# Patient Record
Sex: Male | Born: 1975
Health system: Southern US, Community
[De-identification: ages and names within clinical notes are randomized; demographics above are authoritative.]

## PROBLEM LIST (undated history)

## (undated) ENCOUNTER — Emergency Department (HOSPITAL_COMMUNITY): Admission: EM | Payer: Self-pay | Source: Home / Self Care

## (undated) DIAGNOSIS — M1990 Unspecified osteoarthritis, unspecified site: Secondary | ICD-10-CM

## (undated) DIAGNOSIS — M109 Gout, unspecified: Secondary | ICD-10-CM

## (undated) DIAGNOSIS — K219 Gastro-esophageal reflux disease without esophagitis: Secondary | ICD-10-CM

## (undated) DIAGNOSIS — Z9889 Other specified postprocedural states: Secondary | ICD-10-CM

## (undated) DIAGNOSIS — J45909 Unspecified asthma, uncomplicated: Secondary | ICD-10-CM

## (undated) DIAGNOSIS — E785 Hyperlipidemia, unspecified: Secondary | ICD-10-CM

## (undated) HISTORY — DX: Gout, unspecified: M10.9

## (undated) HISTORY — DX: Hyperlipidemia, unspecified: E78.5

## (undated) HISTORY — DX: Gastro-esophageal reflux disease without esophagitis: K21.9

---

## 2001-03-21 ENCOUNTER — Emergency Department (HOSPITAL_COMMUNITY): Admission: EM | Admit: 2001-03-21 | Discharge: 2001-03-22 | Payer: Self-pay | Admitting: Emergency Medicine

## 2012-10-19 ENCOUNTER — Telehealth: Payer: Self-pay | Admitting: Family Medicine

## 2013-03-09 ENCOUNTER — Ambulatory Visit (INDEPENDENT_AMBULATORY_CARE_PROVIDER_SITE_OTHER): Payer: Managed Care, Other (non HMO) | Admitting: Family Medicine

## 2013-03-09 ENCOUNTER — Encounter: Payer: Self-pay | Admitting: Family Medicine

## 2013-03-09 VITALS — BP 120/75 | HR 54 | Temp 97.9°F | Ht 75.0 in | Wt 255.8 lb

## 2013-03-09 DIAGNOSIS — M109 Gout, unspecified: Secondary | ICD-10-CM

## 2013-03-09 DIAGNOSIS — Z0000 Encounter for general adult medical examination without abnormal findings: Secondary | ICD-10-CM

## 2013-03-09 DIAGNOSIS — K219 Gastro-esophageal reflux disease without esophagitis: Secondary | ICD-10-CM

## 2013-03-09 LAB — POCT CBC
Granulocyte percent: 75.1 %G (ref 37–80)
HCT, POC: 48.4 % (ref 43.5–53.7)
Hemoglobin: 15.9 g/dL (ref 14.1–18.1)
Lymph, poc: 1.5 (ref 0.6–3.4)
MCH, POC: 31.3 pg — AB (ref 27–31.2)
MCHC: 32.8 g/dL (ref 31.8–35.4)
MCV: 95.5 fL (ref 80–97)
MPV: 8.4 fL (ref 0–99.8)
POC Granulocyte: 5 (ref 2–6.9)
POC LYMPH PERCENT: 23.1 %L (ref 10–50)
Platelet Count, POC: 220 10*3/uL (ref 142–424)
RBC: 5.1 M/uL (ref 4.69–6.13)
RDW, POC: 12.6 %
WBC: 6.6 10*3/uL (ref 4.6–10.2)

## 2013-03-09 MED ORDER — INDOMETHACIN ER 75 MG PO CPCR: 75.0000 mg | ORAL_CAPSULE | Freq: Two times a day (BID) | ORAL | Status: DC

## 2013-03-09 MED ORDER — OMEPRAZOLE 40 MG PO CPDR: 40.0000 mg | DELAYED_RELEASE_CAPSULE | Freq: Every day | ORAL | Status: DC

## 2013-03-09 MED ORDER — ALLOPURINOL 300 MG PO TABS: 300.0000 mg | ORAL_TABLET | Freq: Every day | ORAL | Status: DC

## 2013-03-09 NOTE — Progress Notes (Signed)
  Subjective:    Patient ID: Edwin Moreno, male    DOB: [birth year], 37 y.o.   MRN: [id number]  HPI This 37 y.o. male presents for evaluation of routine visit.  He has hx of gout And GERD.  He has occasional gout flares.  He denies any other acute problems. He states his GERD is controlled with prilosec.   Review of Systems No chest pain, SOB, HA, dizziness, vision change, N/V, diarrhea, constipation, dysuria, urinary urgency or frequency, myalgias, arthralgias or rash.     Objective:   Physical Exam Vital signs noted  Well developed well nourished male.  HEENT - Head atraumatic Normocephalic                Eyes - PERRLA, Conjuctiva - clear Sclera- Clear EOMI                Ears - EAC's Wnl TM's Wnl Gross Hearing WNL                Nose - Nares patent                 Throat - oropharanx wnl Respiratory - Lungs CTA bilateral Cardiac - RRR S1 and S2 without murmur GI - Abdomen soft Nontender and bowel sounds active x 4 Extremities - No edema. Neuro - Grossly intact.       Assessment & Plan:  Gout - Plan: allopurinol (ZYLOPRIM) 300 MG tablet, indomethacin (INDOCIN SR) 75 MG CR capsule, Thyroid Panel With TSH.  Discussed low purine diet.  GERD (gastroesophageal reflux disease) - Plan: omeprazole (PRILOSEC) 40 MG capsule  Routine general medical examination at a health care facility - Plan: POCT CBC, CMP14+EGFR, Thyroid Panel With TSH, Lipid panel, Uric acid  Follow up annually.

## 2013-03-09 NOTE — Patient Instructions (Signed)
Gout  Gout is an inflammatory condition (arthritis) caused by a buildup of uric acid crystals in the joints. Uric acid is a chemical that is normally present in the blood. Under some circumstances, uric acid can form into crystals in your joints. This causes joint redness, soreness, and swelling (inflammation). Repeat attacks are common. Over time, uric acid crystals can form into masses (tophi) near a joint, causing disfigurement. Gout is treatable and often preventable.  CAUSES   The disease begins with elevated levels of uric acid in the blood. Uric acid is produced by your body when it breaks down a naturally found substance called purines. This also happens when you eat certain foods such as meats and fish. Causes of an elevated uric acid level include:   Being passed down from parent to child (heredity).   Diseases that cause increased uric acid production (obesity, psoriasis, some cancers).   Excessive alcohol use.   Diet, especially diets rich in meat and seafood.   Medicines, including certain cancer-fighting drugs (chemotherapy), diuretics, and aspirin.   Chronic kidney disease. The kidneys are no longer able to remove uric acid well.   Problems with metabolism.  Conditions strongly associated with gout include:   Obesity.   High blood pressure.   High cholesterol.   Diabetes.  Not everyone with elevated uric acid levels gets gout. It is not understood why some people get gout and others do not. Surgery, joint injury, and eating too much of certain foods are some of the factors that can lead to gout.  SYMPTOMS    An attack of gout comes on quickly. It causes intense pain with redness, swelling, and warmth in a joint.   Fever can occur.   Often, only one joint is involved. Certain joints are more commonly involved:   Base of the big toe.   Knee.   Ankle.   Wrist.   Finger.  Without treatment, an attack usually goes away in a few days to weeks. Between attacks, you usually will not have  symptoms, which is different from many other forms of arthritis.  DIAGNOSIS   Your caregiver will suspect gout based on your symptoms and exam. Removal of fluid from the joint (arthrocentesis) is done to check for uric acid crystals. Your caregiver will give you a medicine that numbs the area (local anesthetic) and use a needle to remove joint fluid for exam. Gout is confirmed when uric acid crystals are seen in joint fluid, using a special microscope. Sometimes, blood, urine, and X-ray tests are also used.  TREATMENT   There are 2 phases to gout treatment: treating the sudden onset (acute) attack and preventing attacks (prophylaxis).  Treatment of an Acute Attack   Medicines are used. These include anti-inflammatory medicines or steroid medicines.   An injection of steroid medicine into the affected joint is sometimes necessary.   The painful joint is rested. Movement can worsen the arthritis.   You may use warm or cold treatments on painful joints, depending which works best for you.   Discuss the use of coffee, vitamin C, or cherries with your caregiver. These may be helpful treatment options.  Treatment to Prevent Attacks  After the acute attack subsides, your caregiver may advise prophylactic medicine. These medicines either help your kidneys eliminate uric acid from your body or decrease your uric acid production. You may need to stay on these medicines for a very long time.  The early phase of treatment with prophylactic medicine can be associated   with an increase in acute gout attacks. For this reason, during the first few months of treatment, your caregiver may also advise you to take medicines usually used for acute gout treatment. Be sure you understand your caregiver's directions.  You should also discuss dietary treatment with your caregiver. Certain foods such as meats and fish can increase uric acid levels. Other foods such as dairy can decrease levels. Your caregiver can give you a list of foods  to avoid.  HOME CARE INSTRUCTIONS    Do not take aspirin to relieve pain. This raises uric acid levels.   Only take over-the-counter or prescription medicines for pain, discomfort, or fever as directed by your caregiver.   Rest the joint as much as possible. When in bed, keep sheets and blankets off painful areas.   Keep the affected joint raised (elevated).   Use crutches if the painful joint is in your leg.   Drink enough water and fluids to keep your urine clear or pale yellow. This helps your body get rid of uric acid. Do not drink alcoholic beverages. They slow the passage of uric acid.   Follow your caregiver's dietary instructions. Pay careful attention to the amount of protein you eat. Your daily diet should emphasize fruits, vegetables, whole grains, and fat-free or low-fat milk products.   Maintain a healthy body weight.  SEEK MEDICAL CARE IF:    You have an oral temperature above 102 F (38.9 C).   You develop diarrhea, vomiting, or any side effects from medicines.   You do not feel better in 24 hours, or you are getting worse.  SEEK IMMEDIATE MEDICAL CARE IF:    Your joint becomes suddenly more tender and you have:   Chills.   An oral temperature above 102 F (38.9 C), not controlled by medicine.  MAKE SURE YOU:    Understand these instructions.   Will watch your condition.   Will get help right away if you are not doing well or get worse.  Document Released: 06/18/2000 Document Revised: 09/13/2011 Document Reviewed: 09/29/2009  [employer] Patient Information 2014 [employer].

## 2013-03-12 LAB — THYROID PANEL WITH TSH
Free Thyroxine Index: 2.8 (ref 1.2–4.9)
T3 Uptake Ratio: 30 % (ref 24–39)
T4, Total: 9.3 ug/dL (ref 4.5–12.0)
TSH: 1.75 u[IU]/mL (ref 0.450–4.500)

## 2013-03-12 LAB — CMP14+EGFR
ALT: 33 IU/L (ref 0–44)
AST: 24 IU/L (ref 0–40)
Albumin/Globulin Ratio: 2.3 (ref 1.1–2.5)
Albumin: 5 g/dL (ref 3.5–5.5)
Alkaline Phosphatase: 80 IU/L (ref 39–117)
BUN/Creatinine Ratio: 14 (ref 8–19)
BUN: 14 mg/dL (ref 6–20)
CO2: 25 mmol/L (ref 18–29)
Calcium: 10.2 mg/dL (ref 8.7–10.2)
Chloride: 97 mmol/L (ref 97–108)
Creatinine, Ser: 1.02 mg/dL (ref 0.76–1.27)
GFR calc Af Amer: 109 mL/min/{1.73_m2} (ref >59)
GFR calc non Af Amer: 94 mL/min/{1.73_m2} (ref >59)
Globulin, Total: 2.2 g/dL (ref 1.5–4.5)
Glucose: 82 mg/dL (ref 65–99)
Potassium: 4.3 mmol/L (ref 3.5–5.2)
Sodium: 140 mmol/L (ref 134–144)
Total Bilirubin: 1.1 mg/dL (ref 0.0–1.2)
Total Protein: 7.2 g/dL (ref 6.0–8.5)

## 2013-03-12 LAB — LIPID PANEL
Chol/HDL Ratio: 6.8 ratio units — ABNORMAL HIGH (ref 0.0–5.0)
Cholesterol, Total: 231 mg/dL — ABNORMAL HIGH (ref 100–199)
HDL: 34 mg/dL — ABNORMAL LOW (ref >39)
LDL Calculated: 149 mg/dL — ABNORMAL HIGH (ref 0–99)
Triglycerides: 240 mg/dL — ABNORMAL HIGH (ref 0–149)
VLDL Cholesterol Cal: 48 mg/dL — ABNORMAL HIGH (ref 5–40)

## 2013-03-12 LAB — URIC ACID: Uric Acid: 6.2 mg/dL (ref 3.7–8.6)

## 2013-03-14 ENCOUNTER — Other Ambulatory Visit: Payer: Self-pay | Admitting: Family Medicine

## 2013-03-14 MED ORDER — PRAVASTATIN SODIUM 20 MG PO TABS: 20.0000 mg | ORAL_TABLET | Freq: Every day | ORAL | Status: DC

## 2013-03-23 ENCOUNTER — Telehealth: Payer: Self-pay | Admitting: *Deleted

## 2013-03-23 NOTE — Telephone Encounter (Signed)
Message copied by Bearl Mulberry on Fri Mar 23, 2013  5:10 PM ------      Message from: Deatra Canter      Created: Wed Mar 14, 2013  3:37 PM       Cholesterol elevated and start pravachol 20mg  po qd and recheck FLP and LFT in 3 months ------

## 2013-03-23 NOTE — Telephone Encounter (Signed)
Pt notified of results Verbalizes understanding 

## 2013-06-25 ENCOUNTER — Ambulatory Visit (INDEPENDENT_AMBULATORY_CARE_PROVIDER_SITE_OTHER): Payer: Managed Care, Other (non HMO)

## 2013-06-25 DIAGNOSIS — Z23 Encounter for immunization: Secondary | ICD-10-CM

## 2014-02-04 ENCOUNTER — Encounter: Payer: Self-pay | Admitting: Family Medicine

## 2014-02-04 ENCOUNTER — Ambulatory Visit (INDEPENDENT_AMBULATORY_CARE_PROVIDER_SITE_OTHER): Payer: PRIVATE HEALTH INSURANCE | Admitting: Family Medicine

## 2014-02-04 VITALS — BP 130/76 | HR 74 | Temp 97.9°F | Ht 75.0 in | Wt 260.0 lb

## 2014-02-04 DIAGNOSIS — M10461 Other secondary gout, right knee: Secondary | ICD-10-CM

## 2014-02-04 DIAGNOSIS — K2100 Gastro-esophageal reflux disease with esophagitis, without bleeding: Secondary | ICD-10-CM

## 2014-02-04 DIAGNOSIS — M545 Low back pain, unspecified: Secondary | ICD-10-CM

## 2014-02-04 DIAGNOSIS — M109 Gout, unspecified: Secondary | ICD-10-CM

## 2014-02-04 DIAGNOSIS — E785 Hyperlipidemia, unspecified: Secondary | ICD-10-CM

## 2014-02-04 MED ORDER — CYCLOBENZAPRINE HCL 10 MG PO TABS: 10.0000 mg | ORAL_TABLET | Freq: Three times a day (TID) | ORAL | Status: DC | PRN

## 2014-02-04 MED ORDER — ALLOPURINOL 300 MG PO TABS: 300.0000 mg | ORAL_TABLET | Freq: Every day | ORAL | Status: DC

## 2014-02-04 MED ORDER — OMEPRAZOLE 40 MG PO CPDR: 40.0000 mg | DELAYED_RELEASE_CAPSULE | Freq: Every day | ORAL | Status: DC

## 2014-02-04 MED ORDER — INDOMETHACIN ER 75 MG PO CPCR: 75.0000 mg | ORAL_CAPSULE | Freq: Two times a day (BID) | ORAL | Status: DC

## 2014-02-04 MED ORDER — PRAVASTATIN SODIUM 20 MG PO TABS: 20.0000 mg | ORAL_TABLET | Freq: Every day | ORAL | Status: DC

## 2014-02-05 NOTE — Progress Notes (Signed)
   Subjective:    Patient ID: MARKEE MATERA, male    DOB: [birth year], 38 y.o.   MRN: [id number]  HPI This 38 y.o. male presents for evaluation of lower back pain and is here for routine follow up.   Review of Systems No chest pain, SOB, HA, dizziness, vision change, N/V, diarrhea, constipation, dysuria, urinary urgency or frequency, myalgias, arthralgias or rash.     Objective:   Physical Exam Vital signs noted  Well developed well nourished male.  HEENT - Head atraumatic Normocephalic                Eyes - PERRLA, Conjuctiva - clear Sclera- Clear EOMI                Ears - EAC's Wnl TM's Wnl Gross Hearing WNL                Nose - Nares patent                 Throat - oropharanx wnl Respiratory - Lungs CTA bilateral Cardiac - RRR S1 and S2 without murmur GI - Abdomen soft Nontender and bowel sounds active x 4 Extremities - No edema. Neuro - Grossly intact. MS - TTP left LS muscles      Assessment & Plan:  Other secondary gout of right knee - Plan: allopurinol (ZYLOPRIM) 300 MG tablet, indomethacin (INDOCIN SR) 75 MG CR capsule  Gastroesophageal reflux disease with esophagitis - Plan: omeprazole (PRILOSEC) 40 MG capsule  Other and unspecified hyperlipidemia - Plan: pravastatin (PRAVACHOL) 20 MG tablet  Left-sided low back pain without sciatica - Plan: cyclobenzaprine (FLEXERIL) 10 MG tablet  Lysbeth Penner FNP

## 2014-04-16 ENCOUNTER — Ambulatory Visit (INDEPENDENT_AMBULATORY_CARE_PROVIDER_SITE_OTHER): Payer: PRIVATE HEALTH INSURANCE

## 2014-04-16 DIAGNOSIS — Z23 Encounter for immunization: Secondary | ICD-10-CM

## 2014-04-23 ENCOUNTER — Telehealth: Payer: Self-pay | Admitting: Family Medicine

## 2014-04-23 NOTE — Telephone Encounter (Signed)
Spoke with pt regarding appt.  ?

## 2014-09-04 ENCOUNTER — Telehealth: Payer: Self-pay | Admitting: Family Medicine

## 2014-09-04 NOTE — Telephone Encounter (Signed)
Pt aware.

## 2014-12-03 ENCOUNTER — Ambulatory Visit: Payer: PRIVATE HEALTH INSURANCE | Admitting: Nurse Practitioner

## 2014-12-03 ENCOUNTER — Telehealth: Payer: Self-pay | Admitting: *Deleted

## 2014-12-03 ENCOUNTER — Telehealth: Payer: PRIVATE HEALTH INSURANCE | Admitting: Nurse Practitioner

## 2014-12-03 DIAGNOSIS — J0100 Acute maxillary sinusitis, unspecified: Secondary | ICD-10-CM

## 2014-12-03 MED ORDER — AMOXICILLIN-POT CLAVULANATE 875-125 MG PO TABS: 1.0000 | ORAL_TABLET | Freq: Two times a day (BID) | ORAL | Status: DC

## 2014-12-03 NOTE — Progress Notes (Signed)

## 2014-12-03 NOTE — Telephone Encounter (Signed)
Pt wanted RX sent into [facility] changed

## 2014-12-17 ENCOUNTER — Other Ambulatory Visit: Payer: Self-pay | Admitting: Family Medicine

## 2014-12-23 ENCOUNTER — Other Ambulatory Visit: Payer: Self-pay | Admitting: Family Medicine

## 2014-12-23 NOTE — Telephone Encounter (Signed)
Last seen 02/04/14  B [place]

## 2014-12-24 ENCOUNTER — Other Ambulatory Visit: Payer: Self-pay

## 2014-12-24 DIAGNOSIS — M10461 Other secondary gout, right knee: Secondary | ICD-10-CM

## 2015-01-01 ENCOUNTER — Ambulatory Visit (INDEPENDENT_AMBULATORY_CARE_PROVIDER_SITE_OTHER): Payer: PRIVATE HEALTH INSURANCE | Admitting: Physician Assistant

## 2015-01-01 ENCOUNTER — Encounter: Payer: Self-pay | Admitting: Physician Assistant

## 2015-01-01 VITALS — BP 127/82 | HR 59 | Temp 98.0°F | Ht 75.0 in | Wt 258.0 lb

## 2015-01-01 DIAGNOSIS — M542 Cervicalgia: Secondary | ICD-10-CM

## 2015-01-01 DIAGNOSIS — M10461 Other secondary gout, right knee: Secondary | ICD-10-CM | POA: Diagnosis not present

## 2015-01-01 DIAGNOSIS — M1000 Idiopathic gout, unspecified site: Secondary | ICD-10-CM | POA: Diagnosis not present

## 2015-01-01 MED ORDER — INDOMETHACIN ER 75 MG PO CPCR: 75.0000 mg | ORAL_CAPSULE | Freq: Two times a day (BID) | ORAL | Status: DC

## 2015-01-01 NOTE — Patient Instructions (Signed)

## 2015-01-01 NOTE — Progress Notes (Signed)
Subjective:     Patient ID: Edwin Moreno, male   DOB: 1976/06/04, 39 y.o.   MRN: [id number]  HPI Pt was working out on incline bench when he felt a pull to the L post neck/scalp area Sx only lasted a short time Now over the last week will have pain to the same area that radiates to the scalp causing headaches No OTC meds for sx No radiation to the arm/shoulder No hx of same Sx worse with sitting  Review of Systems  Constitutional: Negative.   Musculoskeletal: Positive for arthralgias and neck pain.       Objective:   Physical Exam NAD FROM of the C-spine- sx at extremes of all motions Shoulder shrug equal but with some sx No palp spasm + TTP of the L trap area to the post cerv region Strength equal in the upper ext Pulses /sensory good to upper ext    Assessment:     Neck pain/Trap strain    Plan:     Heat/Ice OTC NSAIDS Massage Gentle stretching Return to lifting slowly and as tol Pt also wanting rf on his Indocin for Gout flares It has been ext time since he has had labs so this was done today and med rf'd

## 2015-01-02 LAB — CMP14+EGFR
A/G RATIO: 2.1 (ref 1.1–2.5)
ALT: 31 IU/L (ref 0–44)
AST: 28 IU/L (ref 0–40)
Albumin: 4.7 g/dL (ref 3.5–5.5)
Alkaline Phosphatase: 74 IU/L (ref 39–117)
BUN / CREAT RATIO: 15 (ref 8–19)
BUN: 15 mg/dL (ref 6–20)
Bilirubin Total: 0.6 mg/dL (ref 0.0–1.2)
CALCIUM: 9.7 mg/dL (ref 8.7–10.2)
CO2: 23 mmol/L (ref 18–29)
CREATININE: 0.97 mg/dL (ref 0.76–1.27)
Chloride: 101 mmol/L (ref 97–108)
GFR calc Af Amer: 114 mL/min/{1.73_m2} (ref >59)
GFR calc non Af Amer: 99 mL/min/{1.73_m2} (ref >59)
Globulin, Total: 2.2 g/dL (ref 1.5–4.5)
Glucose: 80 mg/dL (ref 65–99)
POTASSIUM: 4.3 mmol/L (ref 3.5–5.2)
Sodium: 143 mmol/L (ref 134–144)
TOTAL PROTEIN: 6.9 g/dL (ref 6.0–8.5)

## 2015-01-03 NOTE — Progress Notes (Signed)
lmtcb

## 2015-02-07 ENCOUNTER — Ambulatory Visit (INDEPENDENT_AMBULATORY_CARE_PROVIDER_SITE_OTHER): Payer: PRIVATE HEALTH INSURANCE | Admitting: Family Medicine

## 2015-02-07 ENCOUNTER — Encounter: Payer: Self-pay | Admitting: Family Medicine

## 2015-02-07 ENCOUNTER — Ambulatory Visit (INDEPENDENT_AMBULATORY_CARE_PROVIDER_SITE_OTHER): Payer: PRIVATE HEALTH INSURANCE

## 2015-02-07 DIAGNOSIS — M545 Low back pain, unspecified: Secondary | ICD-10-CM

## 2015-02-07 DIAGNOSIS — M542 Cervicalgia: Secondary | ICD-10-CM | POA: Diagnosis not present

## 2015-02-07 MED ORDER — PREDNISONE 10 MG PO TABS: ORAL_TABLET | ORAL | Status: DC

## 2015-02-07 MED ORDER — CYCLOBENZAPRINE HCL 10 MG PO TABS: 10.0000 mg | ORAL_TABLET | Freq: Three times a day (TID) | ORAL | Status: DC | PRN

## 2015-02-07 NOTE — Progress Notes (Signed)
Subjective:  Patient ID: Edwin Moreno, male    DOB: 20-Aug-1975  Age: 39 y.o. MRN: [id number]  CC: Neck Pain   HPI Edwin Moreno presents for neck pain increasing for one month. He has 4/10 posterior neck pain. This is worsened by turning his head from one side to the other although he is able to do so. It is making his work difficult but he is not in a position to take time off. This has not affected his vision speech hearing etc. Pain at neck described as a sharp pain. It is actually made better by laying down and worsens through the day as he has to be up and about.  History Edwin Moreno has no past medical history on file.   He has no past surgical history on file.   His family history is not on file.He reports that he has never smoked. He does not have any smokeless tobacco history on file. He reports that he drinks alcohol. He reports that he does not use illicit drugs.  Outpatient Prescriptions Prior to Visit  Medication Sig Dispense Refill  . allopurinol (ZYLOPRIM) 300 MG tablet Take 1 tablet (300 mg total) by mouth daily. 90 tablet 4  . indomethacin (INDOCIN SR) 75 MG CR capsule Take 1 capsule (75 mg total) by mouth 2 (two) times daily with a meal. 180 capsule 4  . omeprazole (PRILOSEC) 40 MG capsule Take 1 capsule (40 mg total) by mouth daily. 90 capsule 4  . pravastatin (PRAVACHOL) 20 MG tablet Take 1 tablet (20 mg total) by mouth daily. 90 tablet 3  . cyclobenzaprine (FLEXERIL) 10 MG tablet Take 1 tablet (10 mg total) by mouth 3 (three) times daily as needed for muscle spasms. (Patient not taking: Reported on 02/07/2015) 30 tablet 0   No facility-administered medications prior to visit.    ROS Review of Systems  Constitutional: Negative for fever, chills and diaphoresis.  HENT: Negative for congestion, rhinorrhea and sore throat.   Respiratory: Negative for cough, shortness of breath and wheezing.   Cardiovascular: Negative for chest pain.  Gastrointestinal: Negative for  nausea, vomiting, abdominal pain, diarrhea, constipation and abdominal distention.  Genitourinary: Negative for dysuria and frequency.  Musculoskeletal: Negative for joint swelling and arthralgias.  Skin: Negative for rash.  Neurological: Negative for headaches.    Objective:  BP 125/84 mmHg  Pulse 72  Temp(Src) 98.1 F (36.7 C) (Oral)  Ht 6\' 3"  (1.905 m)  Wt 253 lb 6.4 oz (114.941 kg)  BMI 31.67 kg/m2  BP Readings from Last 3 Encounters:  02/07/15 125/84  01/01/15 127/82  02/04/14 130/76    Wt Readings from Last 3 Encounters:  02/07/15 253 lb 6.4 oz (114.941 kg)  01/01/15 258 lb (117.028 kg)  02/04/14 260 lb (117.935 kg)     Physical Exam  Constitutional: He is oriented to person, place, and time. He appears well-developed and well-nourished.  HENT:  Head: Normocephalic and atraumatic.  Right Ear: Tympanic membrane and external ear normal. No decreased hearing is noted.  Left Ear: Tympanic membrane and external ear normal. No decreased hearing is noted.  Mouth/Throat: No oropharyngeal exudate or posterior oropharyngeal erythema.  Eyes: Pupils are equal, round, and reactive to light.  Neck: Normal range of motion. No tracheal deviation present. No thyromegaly present.  Although the range of motion is normal it is somewhat stiff and slow. There is tightness noted in the musculature of the posterior cervical spinalis  Cardiovascular: Normal rate and regular rhythm.  No murmur heard. Pulmonary/Chest: Breath sounds normal. No stridor. No respiratory distress.  Musculoskeletal: He exhibits tenderness (posterior neck).  Lymphadenopathy:    He has no cervical adenopathy.  Neurological: He is alert and oriented to person, place, and time. No cranial nerve deficit.  Skin: Skin is warm and dry.  Psychiatric: He has a normal mood and affect.  Vitals reviewed.   No results found for: HGBA1C  Lab Results  Component Value Date   WBC 6.6 03/09/2013   HGB 15.9 03/09/2013    HCT 48.4 03/09/2013   GLUCOSE 80 01/01/2015   CHOL 231* 03/09/2013   TRIG 240* 03/09/2013   HDL 34* 03/09/2013   LDLCALC 149* 03/09/2013   ALT 31 01/01/2015   AST 28 01/01/2015   NA 143 01/01/2015   K 4.3 01/01/2015   CL 101 01/01/2015   CREATININE 0.97 01/01/2015   BUN 15 01/01/2015   CO2 23 01/01/2015   TSH 1.750 03/09/2013    No results found.  Assessment & Plan:   [name] was seen today for neck pain.  Diagnoses and all orders for this visit:  Cervicalgia Orders: -     cyclobenzaprine (FLEXERIL) 10 MG tablet; Take 1 tablet (10 mg total) by mouth 3 (three) times daily as needed for muscle spasms. -     predniSONE (DELTASONE) 10 MG tablet; Take 5 daily for 3 days followed by 4,3,2 and 1 for 3 days each. -     DG Cervical Spine Complete; Future   I am having Mr. Luddy start on predniSONE. I am also having him maintain his allopurinol, pravastatin, omeprazole, indomethacin, and cyclobenzaprine.  Meds ordered this encounter  Medications  . cyclobenzaprine (FLEXERIL) 10 MG tablet    Sig: Take 1 tablet (10 mg total) by mouth 3 (three) times daily as needed for muscle spasms.    Dispense:  30 tablet    Refill:  0  . predniSONE (DELTASONE) 10 MG tablet    Sig: Take 5 daily for 3 days followed by 4,3,2 and 1 for 3 days each.    Dispense:  45 tablet    Refill:  0     Follow-up: Return in about 2 weeks (around 02/21/2015), or if symptoms worsen or fail to improve.  [name], M.D.

## 2015-04-30 ENCOUNTER — Other Ambulatory Visit (INDEPENDENT_AMBULATORY_CARE_PROVIDER_SITE_OTHER): Payer: PRIVATE HEALTH INSURANCE

## 2015-04-30 DIAGNOSIS — Z0000 Encounter for general adult medical examination without abnormal findings: Secondary | ICD-10-CM

## 2015-04-30 DIAGNOSIS — M109 Gout, unspecified: Secondary | ICD-10-CM

## 2015-04-30 DIAGNOSIS — E785 Hyperlipidemia, unspecified: Secondary | ICD-10-CM

## 2015-04-30 DIAGNOSIS — K219 Gastro-esophageal reflux disease without esophagitis: Secondary | ICD-10-CM

## 2015-05-01 ENCOUNTER — Other Ambulatory Visit: Payer: Self-pay | Admitting: Family Medicine

## 2015-05-01 LAB — CBC WITH DIFFERENTIAL/PLATELET
Basophils Absolute: 0 10*3/uL (ref 0.0–0.2)
Basos: 0 %
EOS (ABSOLUTE): 0.1 10*3/uL (ref 0.0–0.4)
EOS: 1 %
HEMOGLOBIN: 14.9 g/dL (ref 12.6–17.7)
Hematocrit: 43.3 % (ref 37.5–51.0)
IMMATURE GRANULOCYTES: 0 %
Immature Grans (Abs): 0 10*3/uL (ref 0.0–0.1)
LYMPHS: 37 %
Lymphocytes Absolute: 2.2 10*3/uL (ref 0.7–3.1)
MCH: 33.3 pg — ABNORMAL HIGH (ref 26.6–33.0)
MCHC: 34.4 g/dL (ref 31.5–35.7)
MCV: 97 fL (ref 79–97)
MONOCYTES: 7 %
Monocytes Absolute: 0.4 10*3/uL (ref 0.1–0.9)
NEUTROS PCT: 55 %
Neutrophils Absolute: 3.1 10*3/uL (ref 1.4–7.0)
Platelets: 269 10*3/uL (ref 150–379)
RBC: 4.48 x10E6/uL (ref 4.14–5.80)
RDW: 13.7 % (ref 12.3–15.4)
WBC: 5.8 10*3/uL (ref 3.4–10.8)

## 2015-05-01 LAB — VITAMIN D 25 HYDROXY (VIT D DEFICIENCY, FRACTURES): Vit D, 25-Hydroxy: 28.3 ng/mL — ABNORMAL LOW (ref 30.0–100.0)

## 2015-05-01 LAB — CMP14+EGFR
ALBUMIN: 4.8 g/dL (ref 3.5–5.5)
ALT: 31 IU/L (ref 0–44)
AST: 19 IU/L (ref 0–40)
Albumin/Globulin Ratio: 2 (ref 1.1–2.5)
Alkaline Phosphatase: 64 IU/L (ref 39–117)
BUN / CREAT RATIO: 11 (ref 8–19)
BUN: 12 mg/dL (ref 6–20)
Bilirubin Total: 0.7 mg/dL (ref 0.0–1.2)
CALCIUM: 9.6 mg/dL (ref 8.7–10.2)
CO2: 26 mmol/L (ref 18–29)
CREATININE: 1.1 mg/dL (ref 0.76–1.27)
Chloride: 101 mmol/L (ref 97–106)
GFR calc Af Amer: 97 mL/min/{1.73_m2} (ref >59)
GFR, EST NON AFRICAN AMERICAN: 84 mL/min/{1.73_m2} (ref >59)
Globulin, Total: 2.4 g/dL (ref 1.5–4.5)
Glucose: 76 mg/dL (ref 65–99)
Potassium: 4.4 mmol/L (ref 3.5–5.2)
SODIUM: 144 mmol/L (ref 136–144)
Total Protein: 7.2 g/dL (ref 6.0–8.5)

## 2015-05-01 LAB — LIPID PANEL
CHOL/HDL RATIO: 6.6 ratio — AB (ref 0.0–5.0)
Cholesterol, Total: 206 mg/dL — ABNORMAL HIGH (ref 100–199)
HDL: 31 mg/dL — ABNORMAL LOW (ref >39)
LDL CALC: 128 mg/dL — AB (ref 0–99)
TRIGLYCERIDES: 233 mg/dL — AB (ref 0–149)
VLDL Cholesterol Cal: 47 mg/dL — ABNORMAL HIGH (ref 5–40)

## 2015-05-01 LAB — TSH: TSH: 1.94 u[IU]/mL (ref 0.450–4.500)

## 2015-05-01 MED ORDER — VITAMIN D (ERGOCALCIFEROL) 1.25 MG (50000 UNIT) PO CAPS: 50000.0000 [IU] | ORAL_CAPSULE | ORAL | Status: DC

## 2015-05-01 MED ORDER — FENOFIBRATE 145 MG PO TABS: 145.0000 mg | ORAL_TABLET | Freq: Every day | ORAL | Status: DC

## 2015-05-02 ENCOUNTER — Encounter: Payer: Self-pay | Admitting: Family Medicine

## 2015-05-02 ENCOUNTER — Ambulatory Visit (INDEPENDENT_AMBULATORY_CARE_PROVIDER_SITE_OTHER): Payer: PRIVATE HEALTH INSURANCE | Admitting: Family Medicine

## 2015-05-02 VITALS — BP 123/80 | HR 76 | Temp 98.3°F | Ht 75.0 in | Wt 260.0 lb

## 2015-05-02 DIAGNOSIS — K2100 Gastro-esophageal reflux disease with esophagitis, without bleeding: Secondary | ICD-10-CM

## 2015-05-02 DIAGNOSIS — E785 Hyperlipidemia, unspecified: Secondary | ICD-10-CM

## 2015-05-02 DIAGNOSIS — Z0000 Encounter for general adult medical examination without abnormal findings: Secondary | ICD-10-CM | POA: Diagnosis not present

## 2015-05-02 DIAGNOSIS — R229 Localized swelling, mass and lump, unspecified: Secondary | ICD-10-CM | POA: Insufficient documentation

## 2015-05-02 DIAGNOSIS — M10461 Other secondary gout, right knee: Secondary | ICD-10-CM

## 2015-05-02 DIAGNOSIS — Z23 Encounter for immunization: Secondary | ICD-10-CM | POA: Diagnosis not present

## 2015-05-02 MED ORDER — OMEPRAZOLE 40 MG PO CPDR: 40.0000 mg | DELAYED_RELEASE_CAPSULE | Freq: Every day | ORAL | Status: DC

## 2015-05-02 MED ORDER — PRAVASTATIN SODIUM 20 MG PO TABS: 20.0000 mg | ORAL_TABLET | Freq: Every day | ORAL | Status: DC

## 2015-05-02 MED ORDER — ALLOPURINOL 300 MG PO TABS: 300.0000 mg | ORAL_TABLET | Freq: Every day | ORAL | Status: DC

## 2015-05-02 NOTE — Progress Notes (Signed)
Subjective:  Patient ID: Edwin Moreno, male    DOB: 1976/04/17  Age: 39 y.o. MRN: [id number]  CC: Annual Exam   HPI Edwin Moreno presents for complete physical. He reports occasional heartburn. He is taking omeprazole with fairly good relief.  Patient in for follow-up of elevated cholesterol. Doing well without complaints on current medication. Denies side effects of fenofibrate and statin including myalgia and arthralgia and nausea. Also in today for liver function testing. Currently no chest pain, shortness of breath or other cardiovascular related symptoms noted.  He has had pain in the right knee. This is been diagnosed as gout by or throat. He is currently taking allopurinol. He is using indomethacin when necessary and has not needed it for several months. He has a history also of low vitamin D that is to be reevaluated today.  History Edwin Moreno has no past medical history on file.   He has no past surgical history on file.   His family history is not on file.He reports that he has never smoked. He does not have any smokeless tobacco history on file. He reports that he drinks alcohol. He reports that he does not use illicit drugs.  Outpatient Prescriptions Prior to Visit  Medication Sig Dispense Refill  . fenofibrate (TRICOR) 145 MG tablet Take 1 tablet (145 mg total) by mouth daily. for cholesterol 30 tablet 5  . indomethacin (INDOCIN SR) 75 MG CR capsule Take 1 capsule (75 mg total) by mouth 2 (two) times daily with a meal. 180 capsule 4  . allopurinol (ZYLOPRIM) 300 MG tablet Take 1 tablet (300 mg total) by mouth daily. 90 tablet 4  . omeprazole (PRILOSEC) 40 MG capsule Take 1 capsule (40 mg total) by mouth daily. 90 capsule 4  . pravastatin (PRAVACHOL) 20 MG tablet Take 1 tablet (20 mg total) by mouth daily. 90 tablet 3  . Vitamin D, Ergocalciferol, (DRISDOL) 50000 UNITS CAPS capsule Take 1 capsule (50,000 Units total) by mouth 2 (two) times a week. (Patient not taking: Reported  on 05/02/2015) 16 capsule 0  . cyclobenzaprine (FLEXERIL) 10 MG tablet Take 1 tablet (10 mg total) by mouth 3 (three) times daily as needed for muscle spasms. (Patient not taking: Reported on 05/02/2015) 30 tablet 0  . predniSONE (DELTASONE) 10 MG tablet Take 5 daily for 3 days followed by 4,3,2 and 1 for 3 days each. (Patient not taking: Reported on 05/02/2015) 45 tablet 0   No facility-administered medications prior to visit.    ROS Review of Systems  Constitutional: Negative for fever, chills and diaphoresis.  HENT: Negative for congestion, rhinorrhea and sore throat.   Respiratory: Negative for cough, shortness of breath and wheezing.   Cardiovascular: Negative for chest pain.  Gastrointestinal: Negative for nausea, vomiting, abdominal pain, diarrhea, constipation and abdominal distention.  Genitourinary: Negative for dysuria and frequency.  Musculoskeletal: Negative for joint swelling and arthralgias.  Skin: Negative for rash.  Neurological: Negative for headaches.    Objective:  BP 123/80 mmHg  Pulse 76  Temp(Src) 98.3 F (36.8 C) (Oral)  Ht 6\' 3"  (1.905 m)  Wt 260 lb (117.935 kg)  BMI 32.50 kg/m2  BP Readings from Last 3 Encounters:  05/02/15 123/80  02/07/15 125/84  01/01/15 127/82    Wt Readings from Last 3 Encounters:  05/02/15 260 lb (117.935 kg)  02/07/15 253 lb 6.4 oz (114.941 kg)  01/01/15 258 lb (117.028 kg)     Physical Exam  Constitutional: He is oriented to person, place,  and time. He appears well-developed and well-nourished. No distress.  HENT:  Head: Normocephalic and atraumatic.  Right Ear: External ear normal.  Left Ear: External ear normal.  Nose: Nose normal.  Mouth/Throat: Oropharynx is clear and moist.  Eyes: Conjunctivae and EOM are normal. Pupils are equal, round, and reactive to light.  Neck: Normal range of motion. Neck supple. No thyromegaly present.  Cardiovascular: Normal rate, regular rhythm and normal heart sounds.   No murmur  heard. Pulmonary/Chest: Effort normal and breath sounds normal. No respiratory distress. He has no wheezes. He has no rales.  Abdominal: Soft. Bowel sounds are normal. He exhibits no distension. There is no tenderness.  Lymphadenopathy:    He has no cervical adenopathy.  Neurological: He is alert and oriented to person, place, and time. He has normal reflexes.  Skin: Skin is warm and dry.  Psychiatric: He has a normal mood and affect. His behavior is normal. Judgment and thought content normal.    No results found for: HGBA1C  Lab Results  Component Value Date   WBC 5.8 04/30/2015   HGB 15.9 03/09/2013   HCT 43.3 04/30/2015   GLUCOSE 76 04/30/2015   CHOL 206* 04/30/2015   TRIG 233* 04/30/2015   HDL 31* 04/30/2015   LDLCALC 128* 04/30/2015   ALT 31 04/30/2015   AST 19 04/30/2015   NA 144 04/30/2015   K 4.4 04/30/2015   CL 101 04/30/2015   CREATININE 1.10 04/30/2015   BUN 12 04/30/2015   CO2 26 04/30/2015   TSH 1.940 04/30/2015    No results found.  Assessment & Plan:   Edwin Moreno was seen today for annual exam.  Diagnoses and all orders for this visit:  Wellness examination  Other secondary gout of right knee -     allopurinol (ZYLOPRIM) 300 MG tablet; Take 1 tablet (300 mg total) by mouth daily.  Gastroesophageal reflux disease with esophagitis -     omeprazole (PRILOSEC) 40 MG capsule; Take 1 capsule (40 mg total) by mouth daily.  Hyperlipemia -     pravastatin (PRAVACHOL) 20 MG tablet; Take 1 tablet (20 mg total) by mouth daily.  Encounter for immunization  Nodule, subcutaneous -     Ambulatory referral to Plastic Surgery  Other orders -     Flu Vaccine QUAD 36+ mos IM   I have discontinued [name]'s cyclobenzaprine and predniSONE. I am also having him maintain his indomethacin, fenofibrate, Vitamin D (Ergocalciferol), allopurinol, omeprazole, and pravastatin.  Meds ordered this encounter  Medications  . allopurinol (ZYLOPRIM) 300 MG tablet    Sig:  Take 1 tablet (300 mg total) by mouth daily.    Dispense:  90 tablet    Refill:  4  . omeprazole (PRILOSEC) 40 MG capsule    Sig: Take 1 capsule (40 mg total) by mouth daily.    Dispense:  90 capsule    Refill:  4  . pravastatin (PRAVACHOL) 20 MG tablet    Sig: Take 1 tablet (20 mg total) by mouth daily.    Dispense:  90 tablet    Refill:  3     Follow-up: Return in about 6 months (around 10/31/2015).  [name], M.D.

## 2015-09-18 ENCOUNTER — Ambulatory Visit (INDEPENDENT_AMBULATORY_CARE_PROVIDER_SITE_OTHER): Payer: PRIVATE HEALTH INSURANCE | Admitting: Family Medicine

## 2015-09-18 ENCOUNTER — Encounter: Payer: Self-pay | Admitting: Family Medicine

## 2015-09-18 VITALS — BP 142/86 | HR 55 | Temp 97.0°F | Ht 75.0 in | Wt 263.4 lb

## 2015-09-18 DIAGNOSIS — E781 Pure hyperglyceridemia: Secondary | ICD-10-CM | POA: Diagnosis not present

## 2015-09-18 DIAGNOSIS — M545 Low back pain, unspecified: Secondary | ICD-10-CM

## 2015-09-18 DIAGNOSIS — M109 Gout, unspecified: Secondary | ICD-10-CM

## 2015-09-18 DIAGNOSIS — R300 Dysuria: Secondary | ICD-10-CM | POA: Diagnosis not present

## 2015-09-18 LAB — URINALYSIS, COMPLETE
Bilirubin, UA: NEGATIVE
Glucose, UA: NEGATIVE
KETONES UA: NEGATIVE
Leukocytes, UA: NEGATIVE
NITRITE UA: NEGATIVE
Protein, UA: NEGATIVE
RBC, UA: NEGATIVE
Specific Gravity, UA: 1.015 (ref 1.005–1.030)
Urobilinogen, Ur: 0.2 mg/dL (ref 0.2–1.0)
pH, UA: 7 (ref 5.0–7.5)

## 2015-09-18 LAB — MICROSCOPIC EXAMINATION
BACTERIA UA: NONE SEEN
Epithelial Cells (non renal): NONE SEEN /hpf (ref 0–10)
RBC, UA: NONE SEEN /hpf (ref 0–?)

## 2015-09-18 MED ORDER — CYCLOBENZAPRINE HCL 10 MG PO TABS: 10.0000 mg | ORAL_TABLET | Freq: Three times a day (TID) | ORAL | Status: DC | PRN
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
## 2015-09-18 NOTE — Patient Instructions (Signed)
Great to meet you!  It is most likely your muscles of the back causing you problems. NSAIDs (like indomethacin) and Flexeril ( a muscle relaxer, careful it will make you sleepy) can help  Please come back with any concerns

## 2015-09-19 ENCOUNTER — Other Ambulatory Visit: Payer: Self-pay | Admitting: Family Medicine

## 2015-09-19 DIAGNOSIS — E785 Hyperlipidemia, unspecified: Secondary | ICD-10-CM

## 2015-09-19 LAB — LIPID PANEL
CHOL/HDL RATIO: 5 ratio (ref 0.0–5.0)
CHOLESTEROL TOTAL: 179 mg/dL (ref 100–199)
HDL: 36 mg/dL — AB (ref >39)
LDL Calculated: 114 mg/dL — ABNORMAL HIGH (ref 0–99)
Triglycerides: 145 mg/dL (ref 0–149)
VLDL Cholesterol Cal: 29 mg/dL (ref 5–40)

## 2015-09-19 LAB — CMP14+EGFR
A/G RATIO: 2.1 (ref 1.2–2.2)
ALBUMIN: 5 g/dL (ref 3.5–5.5)
ALT: 26 IU/L (ref 0–44)
AST: 24 IU/L (ref 0–40)
Alkaline Phosphatase: 63 IU/L (ref 39–117)
BILIRUBIN TOTAL: 1.2 mg/dL (ref 0.0–1.2)
BUN/Creatinine Ratio: 13 (ref 8–19)
BUN: 14 mg/dL (ref 6–20)
CALCIUM: 10 mg/dL (ref 8.7–10.2)
CO2: 25 mmol/L (ref 18–29)
Chloride: 99 mmol/L (ref 96–106)
Creatinine, Ser: 1.12 mg/dL (ref 0.76–1.27)
GFR, EST AFRICAN AMERICAN: 95 mL/min/{1.73_m2} (ref >59)
GFR, EST NON AFRICAN AMERICAN: 82 mL/min/{1.73_m2} (ref >59)
Globulin, Total: 2.4 g/dL (ref 1.5–4.5)
Glucose: 86 mg/dL (ref 65–99)
Potassium: 4.2 mmol/L (ref 3.5–5.2)
SODIUM: 141 mmol/L (ref 134–144)
TOTAL PROTEIN: 7.4 g/dL (ref 6.0–8.5)

## 2015-09-19 LAB — URIC ACID: Uric Acid: 7.2 mg/dL (ref 3.7–8.6)

## 2015-09-19 LAB — CBC
HEMATOCRIT: 46.2 % (ref 37.5–51.0)
Hemoglobin: 15.8 g/dL (ref 12.6–17.7)
MCH: 32.2 pg (ref 26.6–33.0)
MCHC: 34.2 g/dL (ref 31.5–35.7)
MCV: 94 fL (ref 79–97)
Platelets: 290 10*3/uL (ref 150–379)
RBC: 4.9 x10E6/uL (ref 4.14–5.80)
RDW: 13.6 % (ref 12.3–15.4)
WBC: 5.5 10*3/uL (ref 3.4–10.8)

## 2015-09-19 MED ORDER — PRAVASTATIN SODIUM 40 MG PO TABS: 20.0000 mg | ORAL_TABLET | Freq: Every day | ORAL | Status: DC

## 2015-09-23 ENCOUNTER — Encounter: Payer: Self-pay | Admitting: *Deleted

## 2015-11-05 ENCOUNTER — Encounter: Payer: Self-pay | Admitting: Family Medicine

## 2015-11-05 ENCOUNTER — Ambulatory Visit (INDEPENDENT_AMBULATORY_CARE_PROVIDER_SITE_OTHER): Payer: PRIVATE HEALTH INSURANCE | Admitting: Family Medicine

## 2015-11-05 VITALS — BP 127/77 | HR 64 | Temp 97.4°F | Ht 74.0 in | Wt 260.0 lb

## 2015-11-05 DIAGNOSIS — Z023 Encounter for examination for recruitment to armed forces: Secondary | ICD-10-CM

## 2015-11-05 DIAGNOSIS — Z139 Encounter for screening, unspecified: Secondary | ICD-10-CM

## 2015-11-05 DIAGNOSIS — Z0289 Encounter for other administrative examinations: Secondary | ICD-10-CM

## 2015-11-05 NOTE — Progress Notes (Signed)
Subjective:  Patient ID: Edwin Moreno, male    DOB: Nov 01, 1975  Age: 40 y.o. MRN: [id number]  CC: Respiratory PE   HPI Edwin Moreno presents for respirator exam.   History Edwin Moreno has a past medical history of Hyperlipidemia; GERD (gastroesophageal reflux disease); and Gout.   Edwin Moreno has no past surgical history on file.   His family history is not on file.Edwin Moreno reports that Edwin Moreno has never smoked. Edwin Moreno does not have any smokeless tobacco history on file. Edwin Moreno reports that Edwin Moreno drinks alcohol. Edwin Moreno reports that Edwin Moreno does not use illicit drugs.    ROS Review of Systems  Constitutional: Negative for fever, chills, diaphoresis and unexpected weight change.  HENT: Negative for congestion, hearing loss, rhinorrhea and sore throat.   Eyes: Negative for visual disturbance.  Respiratory: Negative for cough and shortness of breath.   Cardiovascular: Negative for chest pain.  Gastrointestinal: Negative for abdominal pain, diarrhea and constipation.  Genitourinary: Negative for dysuria and flank pain.  Musculoskeletal: Negative for joint swelling and arthralgias.  Skin: Negative for rash.  Neurological: Negative for dizziness and headaches.  Psychiatric/Behavioral: Negative for sleep disturbance and dysphoric mood.    Objective:  BP 127/77 mmHg  Pulse 64  Temp(Src) 97.4 F (36.3 C) (Oral)  Ht 6\' 2"  (1.88 m)  Wt 260 lb (117.935 kg)  BMI 33.37 kg/m2  SpO2 99%  BP Readings from Last 3 Encounters:  11/05/15 127/77  09/18/15 142/86  05/02/15 123/80    Wt Readings from Last 3 Encounters:  11/05/15 260 lb (117.935 kg)  09/18/15 263 lb 6.4 oz (119.477 kg)  05/02/15 260 lb (117.935 kg)     Physical Exam  Constitutional: Edwin Moreno is oriented to person, place, and time. Edwin Moreno appears well-developed and well-nourished. No distress.  HENT:  Head: Normocephalic and atraumatic.  Right Ear: External ear normal.  Left Ear: External ear normal.  Nose: Nose normal.  Mouth/Throat: Oropharynx is clear and  moist.  Eyes: Conjunctivae and EOM are normal. Pupils are equal, round, and reactive to light.  Neck: Normal range of motion. Neck supple. No thyromegaly present.  Cardiovascular: Normal rate, regular rhythm and normal heart sounds.   No murmur heard. Pulmonary/Chest: Effort normal and breath sounds normal. No respiratory distress. Edwin Moreno has no wheezes. Edwin Moreno has no rales.  Abdominal: Soft. Bowel sounds are normal. Edwin Moreno exhibits no distension. There is no tenderness.  Lymphadenopathy:    Edwin Moreno has no cervical adenopathy.  Neurological: Edwin Moreno is alert and oriented to person, place, and time. Edwin Moreno has normal reflexes.  Skin: Skin is warm and dry.  Psychiatric: Edwin Moreno has a normal mood and affect. His behavior is normal. Judgment and thought content normal.     Lab Results  Component Value Date   WBC 5.5 09/18/2015   HGB 15.9 03/09/2013   HCT 46.2 09/18/2015   PLT 290 09/18/2015   GLUCOSE 86 09/18/2015   CHOL 179 09/18/2015   TRIG 145 09/18/2015   HDL 36* 09/18/2015   LDLCALC 114* 09/18/2015   ALT 26 09/18/2015   AST 24 09/18/2015   NA 141 09/18/2015   K 4.2 09/18/2015   CL 99 09/18/2015   CREATININE 1.12 09/18/2015   BUN 14 09/18/2015   CO2 25 09/18/2015   TSH 1.940 04/30/2015    Assessment & Plan:   Edwin Moreno was seen today for respiratory pe.  Diagnoses and all orders for this visit:  Screening -     Spirometry: Peak  Encounter for fitness for duty examination  Cleared for duty with respiratory as needed. PFT - nml spirometry noted.  I am having Edwin Moreno maintain his indomethacin, fenofibrate, Vitamin D (Ergocalciferol), omeprazole, cyclobenzaprine, and pravastatin.  No orders of the defined types were placed in this encounter.     Follow-up: Return in about 6 months (around 05/07/2016).  [name], M.D.

## 2015-11-27 ENCOUNTER — Other Ambulatory Visit: Payer: Self-pay | Admitting: Family Medicine

## 2015-12-22 ENCOUNTER — Other Ambulatory Visit: Payer: Self-pay

## 2015-12-22 ENCOUNTER — Telehealth: Payer: Self-pay | Admitting: *Deleted

## 2015-12-22 MED ORDER — CEPHALEXIN 500 MG PO CAPS: 500.0000 mg | ORAL_CAPSULE | Freq: Two times a day (BID) | ORAL | Status: DC

## 2015-12-24 ENCOUNTER — Other Ambulatory Visit: Payer: Self-pay

## 2015-12-24 MED ORDER — CEPHALEXIN 500 MG PO CAPS: 500.0000 mg | ORAL_CAPSULE | Freq: Two times a day (BID) | ORAL | Status: DC

## 2016-02-04 NOTE — Telephone Encounter (Signed)
This has already been addressed

## 2016-02-26 ENCOUNTER — Ambulatory Visit: Payer: PRIVATE HEALTH INSURANCE | Admitting: Nurse Practitioner

## 2016-02-27 ENCOUNTER — Encounter: Payer: Self-pay | Admitting: Nurse Practitioner

## 2016-02-27 ENCOUNTER — Ambulatory Visit (INDEPENDENT_AMBULATORY_CARE_PROVIDER_SITE_OTHER): Payer: BC Managed Care – PPO | Admitting: Nurse Practitioner

## 2016-02-27 VITALS — BP 126/79 | HR 48 | Temp 96.8°F | Ht 74.0 in | Wt 260.0 lb

## 2016-02-27 DIAGNOSIS — D1801 Hemangioma of skin and subcutaneous tissue: Secondary | ICD-10-CM | POA: Diagnosis not present

## 2016-02-27 DIAGNOSIS — L989 Disorder of the skin and subcutaneous tissue, unspecified: Secondary | ICD-10-CM | POA: Diagnosis not present

## 2016-02-27 NOTE — Patient Instructions (Signed)
Sutured Wound Care Sutures are stitches that can be used to close wounds. Taking care of your wound properly can help to prevent pain and infection. It can also help your wound to heal more quickly. HOW TO CARE FOR YOUR SUTURED WOUND Wound Care  Keep the wound clean and dry.  If you were given a bandage (dressing), you should change it at least once per day or as directed by your health care provider. You should also change it if it becomes wet or dirty.  Keep the wound completely dry for the first 24 hours or as directed by your health care provider. After that time, you may shower or bathe. However, make sure that the wound is not soaked in water until the sutures have been removed.  Clean the wound one time each day or as directed by your health care provider.  Wash the wound with soap and water.  Rinse the wound with water to remove all soap.  Pat the wound dry with a clean towel. Do not rub the wound.  Aftercleaning the wound, apply a thin layer of antibioticointment as directed by your health care provider. This will help to prevent infection and keep the dressing from sticking to the wound.  Have the sutures removed as directed by your health care provider. General Instructions  Take or apply medicines only as directed by your health care provider.  To help prevent scarring, make sure to cover your wound with sunscreen whenever you are outside after the sutures are removed and the wound is healed. Make sure to wear a sunscreen of at least 30 SPF.  If you were prescribed an antibiotic medicine or ointment, finish all of it even if you start to feel better.  Do not scratch or pick at the wound.  Keep all follow-up visits as directed by your health care provider. This is important.  Check your wound every day for signs of infection. Watch for:   Redness, swelling, or pain.  Fluid, blood, or pus.  Raise (elevate) the injured area above the level of your heart while you  are sitting or lying down, if possible.  Avoid stretching your wound.  Drink enough fluids to keep your urine clear or pale yellow. SEEK MEDICAL CARE IF:  You received a tetanus shot and you have swelling, severe pain, redness, or bleeding at the injection site.  You have a fever.  A wound that was closed breaks open.  You notice a bad smell coming from the wound.  You notice something coming out of the wound, such as wood or glass.  Your pain is not controlled with medicine.  You have increased redness, swelling, or pain at the site of your wound.  You have fluid, blood, or pus coming from your wound.  You notice a change in the color of your skin near your wound.  You need to change the dressing frequently due to fluid, blood, or pus draining from the wound.  You develop a new rash.  You develop numbness around the wound. SEEK IMMEDIATE MEDICAL CARE IF:  You develop severe swelling around the injury site.  Your pain suddenly increases and is severe.  You develop painful lumps near the wound or on skin that is anywhere on your body.  You have a red streak going away from your wound.  The wound is on your hand or foot and you cannot properly move a finger or toe.  The wound is on your hand or foot and   you notice that your fingers or toes look pale or bluish.   This information is not intended to replace advice given to you by your health care provider. Make sure you discuss any questions you have with your health care provider.   Document Released: 07/29/2004 Document Revised: 11/05/2014 Document Reviewed: 01/31/2013 Elsevier Interactive Patient Education 2016 [employer]  

## 2016-02-27 NOTE — Progress Notes (Signed)
   Subjective:    Patient ID: Edwin Moreno, male    DOB: [birth year], 40 y.o.   MRN: [id number]  HPI  Patient comes in today with a lesion on his back he wants shaved off. Lesion has been there for over a year. Has not noticed any changes.   Review of Systems  Constitutional: Negative.   HENT: Negative.   Respiratory: Negative.   Cardiovascular: Negative.   Genitourinary: Negative.   Neurological: Negative.   Psychiatric/Behavioral: Negative.   All other systems reviewed and are negative.      Objective:   Physical Exam  Constitutional: He is oriented to person, place, and time. He appears well-developed and well-nourished.  Cardiovascular: Normal rate, regular rhythm and normal heart sounds.   Pulmonary/Chest: Effort normal and breath sounds normal.  Neurological: He is alert and oriented to person, place, and time.  Skin: Skin is warm and dry.  2cm annular raised lesion slightly reddish in color on right shoulder blade.  Psychiatric: He has a normal mood and affect. His behavior is normal. Judgment and thought content normal.    BP 126/79   Pulse (!) 48   Temp (!) 96.8 F (36 C) (Oral)   Ht 6\' 2"  (1.88 m)   Wt 260 lb (117.9 kg)   BMI 33.38 kg/m   Procedure:  Lidocaine 1% with epi- 55ml  Betadine prep  Punch removal of skin lesion  3-0 ethilon simple interrupted stitches X3  NACL to clean  bandaid applied     Assessment & Plan:   1. Skin lesion of back    Keep clean and dry Watch for signs of infection RTO prn- stitch removal in 10 days  Mary-Margaret Hassell Done, FNP

## 2016-03-02 LAB — PATHOLOGY

## 2016-03-05 ENCOUNTER — Telehealth: Payer: Self-pay | Admitting: Nurse Practitioner

## 2016-03-05 NOTE — Telephone Encounter (Signed)
Patient aware of results.

## 2016-03-26 ENCOUNTER — Other Ambulatory Visit: Payer: Self-pay

## 2016-03-26 MED ORDER — FENOFIBRATE 145 MG PO TABS: ORAL_TABLET | ORAL | 0 refills | Status: DC

## 2016-04-11 ENCOUNTER — Other Ambulatory Visit: Payer: Self-pay | Admitting: Nurse Practitioner

## 2016-04-22 ENCOUNTER — Ambulatory Visit (INDEPENDENT_AMBULATORY_CARE_PROVIDER_SITE_OTHER): Payer: BC Managed Care – PPO | Admitting: *Deleted

## 2016-04-22 DIAGNOSIS — Z23 Encounter for immunization: Secondary | ICD-10-CM | POA: Diagnosis not present

## 2016-04-28 ENCOUNTER — Other Ambulatory Visit: Payer: Self-pay | Admitting: Family Medicine

## 2016-04-28 DIAGNOSIS — K2100 Gastro-esophageal reflux disease with esophagitis, without bleeding: Secondary | ICD-10-CM

## 2016-07-07 ENCOUNTER — Other Ambulatory Visit: Payer: Self-pay | Admitting: Nurse Practitioner

## 2016-09-30 ENCOUNTER — Other Ambulatory Visit: Payer: Self-pay | Admitting: Family Medicine

## 2016-09-30 DIAGNOSIS — K2100 Gastro-esophageal reflux disease with esophagitis, without bleeding: Secondary | ICD-10-CM

## 2016-10-04 NOTE — Telephone Encounter (Signed)
Forwarding to PCP/stacks 

## 2016-12-24 ENCOUNTER — Other Ambulatory Visit: Payer: Self-pay | Admitting: Family Medicine

## 2016-12-24 DIAGNOSIS — K2100 Gastro-esophageal reflux disease with esophagitis, without bleeding: Secondary | ICD-10-CM

## 2016-12-27 NOTE — Telephone Encounter (Signed)
Last seen 02/27/16  MMM  Dr Livia Snellen PCP   Last lipid 09/18/15

## 2017-01-03 ENCOUNTER — Other Ambulatory Visit: Payer: Self-pay | Admitting: Family Medicine

## 2017-01-03 DIAGNOSIS — K2100 Gastro-esophageal reflux disease with esophagitis, without bleeding: Secondary | ICD-10-CM

## 2017-01-04 ENCOUNTER — Other Ambulatory Visit: Payer: Self-pay | Admitting: Family Medicine

## 2017-01-04 NOTE — Telephone Encounter (Signed)
Last refill without being seen 

## 2017-01-04 NOTE — Telephone Encounter (Signed)
Last labs 09/2015

## 2017-03-28 ENCOUNTER — Other Ambulatory Visit: Payer: Self-pay | Admitting: Family Medicine

## 2017-03-28 ENCOUNTER — Other Ambulatory Visit: Payer: Self-pay | Admitting: Nurse Practitioner

## 2017-03-28 DIAGNOSIS — E785 Hyperlipidemia, unspecified: Secondary | ICD-10-CM

## 2017-03-29 NOTE — Telephone Encounter (Signed)
Authorize 30 days only. Then contact the patient letting them know that they will need an appointment before any further prescriptions can be sent in. 

## 2017-05-24 ENCOUNTER — Ambulatory Visit: Payer: BLUE CROSS/BLUE SHIELD | Admitting: Nurse Practitioner

## 2017-05-24 ENCOUNTER — Encounter: Payer: Self-pay | Admitting: Nurse Practitioner

## 2017-05-24 VITALS — BP 128/85 | HR 61 | Temp 97.1°F | Ht 74.0 in | Wt 260.0 lb

## 2017-05-24 DIAGNOSIS — E781 Pure hyperglyceridemia: Secondary | ICD-10-CM

## 2017-05-24 DIAGNOSIS — E785 Hyperlipidemia, unspecified: Secondary | ICD-10-CM

## 2017-05-24 DIAGNOSIS — L989 Disorder of the skin and subcutaneous tissue, unspecified: Secondary | ICD-10-CM

## 2017-05-24 MED ORDER — PRAVASTATIN SODIUM 40 MG PO TABS: 20.0000 mg | ORAL_TABLET | Freq: Every day | ORAL | 1 refills | Status: DC

## 2017-05-24 NOTE — Addendum Note (Signed)
Addended by: Chevis Pretty on: 05/24/2017 12:57 PM   Modules accepted: Orders

## 2017-05-24 NOTE — Progress Notes (Signed)
   Subjective:    Patient ID: Edwin Moreno, male    DOB: 1975-11-14, 41 y.o.   MRN: [id number]  HPI  Patient comes in today with a scalp lesion. Noticed it about 2 years ago and has gotten bigger. Sore to touch.   Review of Systems  Constitutional: Negative.   Respiratory: Negative.   Cardiovascular: Negative.   Musculoskeletal: Negative.   Neurological: Negative.   Psychiatric/Behavioral: Negative.   All other systems reviewed and are negative.      Objective:   Physical Exam  Constitutional: He is oriented to person, place, and time. He appears well-developed and well-nourished. No distress.  Cardiovascular: Normal rate and regular rhythm.  Pulmonary/Chest: Effort normal and breath sounds normal.  Neurological: He is alert and oriented to person, place, and time.  Skin: Skin is warm.  2cm raised flesh colored scalp lesion  Psychiatric: He has a normal mood and affect. His behavior is normal. Judgment and thought content normal.   BP 128/85   Pulse 61   Temp (!) 97.1 F (36.2 C) (Oral)   Ht 6\' 2"  (1.88 m)   Wt 260 lb (117.9 kg)   BMI 33.38 kg/m       Assessment & Plan:   1. Scalp lesion    Orders Placed This Encounter  Procedures  . Ambulatory referral to Dermatology    Referral Priority:   Routine    Referral Type:   Consultation    Referral Reason:   Specialty Services Required    Requested Specialty:   Dermatology    Number of Visits Requested:   1   Do not pick or scratch at area  [place], FNP

## 2017-05-25 LAB — CMP14+EGFR
A/G RATIO: 2 (ref 1.2–2.2)
ALBUMIN: 5 g/dL (ref 3.5–5.5)
ALK PHOS: 59 IU/L (ref 39–117)
ALT: 26 IU/L (ref 0–44)
AST: 25 IU/L (ref 0–40)
BILIRUBIN TOTAL: 0.6 mg/dL (ref 0.0–1.2)
BUN / CREAT RATIO: 12 (ref 9–20)
BUN: 12 mg/dL (ref 6–24)
CHLORIDE: 101 mmol/L (ref 96–106)
CO2: 24 mmol/L (ref 20–29)
Calcium: 9.6 mg/dL (ref 8.7–10.2)
Creatinine, Ser: 1.02 mg/dL (ref 0.76–1.27)
GFR calc non Af Amer: 91 mL/min/{1.73_m2} (ref >59)
GFR, EST AFRICAN AMERICAN: 105 mL/min/{1.73_m2} (ref >59)
GLOBULIN, TOTAL: 2.5 g/dL (ref 1.5–4.5)
Glucose: 81 mg/dL (ref 65–99)
Potassium: 4.3 mmol/L (ref 3.5–5.2)
SODIUM: 143 mmol/L (ref 134–144)
TOTAL PROTEIN: 7.5 g/dL (ref 6.0–8.5)

## 2017-05-25 LAB — LIPID PANEL
CHOLESTEROL TOTAL: 192 mg/dL (ref 100–199)
Chol/HDL Ratio: 5.2 ratio — ABNORMAL HIGH (ref 0.0–5.0)
HDL: 37 mg/dL — ABNORMAL LOW (ref >39)
LDL Calculated: 121 mg/dL — ABNORMAL HIGH (ref 0–99)
TRIGLYCERIDES: 169 mg/dL — AB (ref 0–149)
VLDL CHOLESTEROL CAL: 34 mg/dL (ref 5–40)

## 2017-06-20 DIAGNOSIS — D225 Melanocytic nevi of trunk: Secondary | ICD-10-CM | POA: Diagnosis not present

## 2017-06-20 DIAGNOSIS — D485 Neoplasm of uncertain behavior of skin: Secondary | ICD-10-CM | POA: Diagnosis not present

## 2017-06-20 DIAGNOSIS — D1801 Hemangioma of skin and subcutaneous tissue: Secondary | ICD-10-CM | POA: Diagnosis not present

## 2017-06-24 ENCOUNTER — Ambulatory Visit (INDEPENDENT_AMBULATORY_CARE_PROVIDER_SITE_OTHER): Payer: BLUE CROSS/BLUE SHIELD

## 2017-06-24 DIAGNOSIS — Z23 Encounter for immunization: Secondary | ICD-10-CM

## 2017-07-13 ENCOUNTER — Other Ambulatory Visit: Payer: Self-pay

## 2017-07-13 DIAGNOSIS — K2100 Gastro-esophageal reflux disease with esophagitis, without bleeding: Secondary | ICD-10-CM

## 2017-07-13 DIAGNOSIS — E785 Hyperlipidemia, unspecified: Secondary | ICD-10-CM

## 2017-07-13 MED ORDER — PRAVASTATIN SODIUM 40 MG PO TABS: 20.0000 mg | ORAL_TABLET | Freq: Every day | ORAL | 1 refills | Status: DC

## 2017-07-13 MED ORDER — VITAMIN D (ERGOCALCIFEROL) 1.25 MG (50000 UNIT) PO CAPS: 50000.0000 [IU] | ORAL_CAPSULE | ORAL | 0 refills | Status: DC

## 2017-07-13 MED ORDER — FENOFIBRATE 145 MG PO TABS: ORAL_TABLET | ORAL | 1 refills | Status: DC

## 2017-07-13 MED ORDER — ALLOPURINOL 300 MG PO TABS: 300.0000 mg | ORAL_TABLET | Freq: Every day | ORAL | 1 refills | Status: DC

## 2017-07-13 MED ORDER — OMEPRAZOLE 40 MG PO CPDR: 40.0000 mg | DELAYED_RELEASE_CAPSULE | Freq: Every day | ORAL | 1 refills | Status: DC

## 2017-07-13 MED ORDER — INDOMETHACIN 50 MG PO CAPS: ORAL_CAPSULE | ORAL | 1 refills | Status: DC

## 2017-11-10 ENCOUNTER — Ambulatory Visit (INDEPENDENT_AMBULATORY_CARE_PROVIDER_SITE_OTHER): Payer: BLUE CROSS/BLUE SHIELD | Admitting: *Deleted

## 2017-11-10 DIAGNOSIS — Z23 Encounter for immunization: Secondary | ICD-10-CM

## 2017-11-10 NOTE — Progress Notes (Signed)
Pt given Twinrix vaccine for [place] Pt tolerated well

## 2018-01-25 ENCOUNTER — Ambulatory Visit (INDEPENDENT_AMBULATORY_CARE_PROVIDER_SITE_OTHER): Payer: BLUE CROSS/BLUE SHIELD | Admitting: *Deleted

## 2018-01-25 DIAGNOSIS — Z23 Encounter for immunization: Secondary | ICD-10-CM | POA: Diagnosis not present

## 2018-01-25 NOTE — Progress Notes (Signed)
Pt given Twinrix vaccine Tolerated well 

## 2018-02-10 ENCOUNTER — Other Ambulatory Visit: Payer: Self-pay | Admitting: Nurse Practitioner

## 2018-02-10 DIAGNOSIS — K2100 Gastro-esophageal reflux disease with esophagitis, without bleeding: Secondary | ICD-10-CM

## 2018-02-10 NOTE — Telephone Encounter (Signed)
Last seen 05/24/18  Dr Livia Snellen

## 2018-02-10 NOTE — Telephone Encounter (Signed)
Please make sure patient has f/u w. PCP

## 2018-04-18 ENCOUNTER — Encounter: Payer: Self-pay | Admitting: Family Medicine

## 2018-04-18 ENCOUNTER — Ambulatory Visit: Payer: BLUE CROSS/BLUE SHIELD | Admitting: Family Medicine

## 2018-04-18 VITALS — BP 136/80 | HR 79 | Temp 97.8°F | Ht 74.0 in | Wt 255.0 lb

## 2018-04-18 DIAGNOSIS — H8303 Labyrinthitis, bilateral: Secondary | ICD-10-CM

## 2018-04-18 MED ORDER — PREDNISONE 20 MG PO TABS: ORAL_TABLET | ORAL | 0 refills | Status: DC

## 2018-04-18 NOTE — Progress Notes (Signed)
BP 136/80   Pulse 79   Temp 97.8 F (36.6 C) (Oral)   Ht 6\' 2"  (1.88 m)   Wt 255 lb (115.7 kg)   BMI 32.74 kg/m    Subjective:    Patient ID: Edwin Moreno, male    DOB: 05/07/1976, 42 y.o.   MRN: [id number]  HPI: Edwin Moreno is a 42 y.o. male presenting on 04/18/2018 for Dizziness (on and off x 2 days. 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  He says that yesterday around 10 AM he had an episode of dizziness and spinning and had to lean up against the wall that lasted about 30-40 minutes.  He describes it as a spinning sensation where he just had to sit down.  He says also that some of his colleagues were around him said that he was not necessarily making sense with his speech.  Since yesterday he has had some minor recurring dizziness the last 15 to 20 seconds and describes that both of his arms and legs feel light and he also complains of a mild occipital headache today.  Patient denies any fevers or chills or shortness of breath or wheezing or chest pain.  He says that he took his blood pressure when this was happening at work and it was 190/110 and then 150/100 and today in our office is 136/80.  Relevant past medical, surgical, family and social history reviewed and updated as indicated. Interim medical history since our last visit reviewed. Allergies and medications reviewed and updated.  Review of Systems  Constitutional: Negative for chills and fever.  Eyes: Negative for visual disturbance.  Respiratory: Negative for shortness of breath and wheezing.   Cardiovascular: Negative for chest pain and leg swelling.  Musculoskeletal: Negative for back pain and gait problem.  Skin:  Negative for rash.  Neurological: Positive for dizziness and headaches. Negative for weakness, light-headedness and numbness.  All other systems reviewed and are negative.   Per HPI unless specifically indicated above   Allergies as of 04/18/2018   No Known Allergies     Medication List        Accurate as of 04/18/18  2:48 PM. Always use your most recent med list.          allopurinol 300 MG tablet Commonly known as:  ZYLOPRIM TAKE 1 TABLET DAILY   fenofibrate 145 MG tablet Commonly known as:  TRICOR Take 1 Tablet by mouth once daily FOR cholesterol   indomethacin 50 MG capsule Commonly known as:  INDOCIN Take 1 Capsule by mouth 2 times a day   omeprazole 40 MG capsule Commonly known as:  PRILOSEC TAKE (1) CAPSULE DAILY   pravastatin 40 MG tablet Commonly known as:  PRAVACHOL Take 0.5 tablets (20 mg total) by mouth daily.   predniSONE 20 MG tablet Commonly known as:  DELTASONE 2 po at same time daily for 5 days          Objective:    BP 136/80   Pulse 79   Temp 97.8 F (36.6 C) (Oral)   Ht 6\' 2"  (1.88 m)   Wt 255 lb (115.7 kg)   BMI 32.74 kg/m  Wt Readings from Last 3 Encounters:  04/18/18 255 lb (115.7 kg)  05/24/17 260 lb (117.9 kg)  02/27/16 260 lb (117.9 kg)    Physical Exam  Constitutional: He is oriented to person, place, and time. He appears well-developed and well-nourished. No distress.  Eyes: Conjunctivae are normal. Right eye exhibits no discharge. No scleral icterus.  Neck: No thyromegaly present.  Cardiovascular: Normal rate, regular rhythm, normal heart sounds and intact distal pulses.  No murmur heard. Pulmonary/Chest: Effort normal and breath sounds normal. No respiratory distress. He has no wheezes.  Musculoskeletal: Normal range of motion. He exhibits no edema.  Lymphadenopathy:    He has no cervical adenopathy.  Neurological: He is alert and oriented to person, place, and time. He is not disoriented. He displays normal  reflexes. No cranial nerve deficit or sensory deficit. Coordination and gait normal.  Skin: Skin is warm and dry. No rash noted. He is not diaphoretic.  Psychiatric: He has a normal mood and affect. His behavior is normal.  Nursing note and vitals reviewed.       Assessment & Plan:   Problem List Items Addressed This Visit    None    Visit Diagnoses    Labyrinthitis of both ears    -  Primary   Sound like vertigo versus labyrinthitis, gave a handout for Epley's maneuvers and give some prednisone for possible labyrinthitis      Will treat for possible vertigo versus labyrinthitis, recommend for patient that if anything worsens that he needs to go to the emergency department or come back and see [place] Follow up plan: Return if symptoms worsen or fail to improve.  Counseling provided for all of the vaccine components No orders of the defined types were placed in this encounter.   Caryl Pina, MD [facility] 04/18/2018, 2:48 PM

## 2018-05-31 ENCOUNTER — Other Ambulatory Visit: Payer: Self-pay | Admitting: Family Medicine

## 2018-05-31 DIAGNOSIS — K2100 Gastro-esophageal reflux disease with esophagitis, without bleeding: Secondary | ICD-10-CM

## 2018-08-15 ENCOUNTER — Ambulatory Visit (INDEPENDENT_AMBULATORY_CARE_PROVIDER_SITE_OTHER): Payer: BLUE CROSS/BLUE SHIELD | Admitting: *Deleted

## 2018-08-15 DIAGNOSIS — Z23 Encounter for immunization: Secondary | ICD-10-CM

## 2018-08-15 NOTE — Progress Notes (Signed)
Pt given Twinrix vaccine Tolerated well

## 2018-09-20 ENCOUNTER — Other Ambulatory Visit: Payer: Self-pay | Admitting: Nurse Practitioner

## 2018-09-20 DIAGNOSIS — E785 Hyperlipidemia, unspecified: Secondary | ICD-10-CM

## 2018-09-20 NOTE — Telephone Encounter (Signed)
Needs to be seen for any further refills 

## 2018-12-13 ENCOUNTER — Other Ambulatory Visit: Payer: Self-pay | Admitting: Family Medicine

## 2018-12-13 DIAGNOSIS — K2100 Gastro-esophageal reflux disease with esophagitis, without bleeding: Secondary | ICD-10-CM

## 2018-12-13 DIAGNOSIS — E785 Hyperlipidemia, unspecified: Secondary | ICD-10-CM

## 2018-12-14 NOTE — Telephone Encounter (Signed)
appt made

## 2018-12-14 NOTE — Telephone Encounter (Signed)
Stacks. NTBS. LOV for routine ckup 2018 2 others since were acute visits

## 2018-12-18 ENCOUNTER — Other Ambulatory Visit: Payer: Self-pay

## 2018-12-19 ENCOUNTER — Ambulatory Visit: Payer: BC Managed Care – PPO | Admitting: Family Medicine

## 2018-12-19 ENCOUNTER — Encounter: Payer: Self-pay | Admitting: Family Medicine

## 2018-12-19 VITALS — BP 137/84 | HR 67 | Temp 99.2°F | Ht 74.0 in | Wt 250.0 lb

## 2018-12-19 DIAGNOSIS — E785 Hyperlipidemia, unspecified: Secondary | ICD-10-CM | POA: Diagnosis not present

## 2018-12-19 DIAGNOSIS — K2100 Gastro-esophageal reflux disease with esophagitis, without bleeding: Secondary | ICD-10-CM

## 2018-12-19 DIAGNOSIS — E781 Pure hyperglyceridemia: Secondary | ICD-10-CM

## 2018-12-19 DIAGNOSIS — M109 Gout, unspecified: Secondary | ICD-10-CM | POA: Diagnosis not present

## 2018-12-19 LAB — LIPID PANEL

## 2018-12-19 MED ORDER — INDOMETHACIN 50 MG PO CAPS: ORAL_CAPSULE | ORAL | 1 refills | Status: DC

## 2018-12-19 MED ORDER — ALLOPURINOL 300 MG PO TABS: 300.0000 mg | ORAL_TABLET | Freq: Every day | ORAL | 1 refills | Status: DC

## 2018-12-19 MED ORDER — PRAVASTATIN SODIUM 40 MG PO TABS: ORAL_TABLET | ORAL | 1 refills | Status: DC

## 2018-12-19 MED ORDER — OMEPRAZOLE 40 MG PO CPDR: DELAYED_RELEASE_CAPSULE | ORAL | 1 refills | Status: DC

## 2018-12-19 NOTE — Progress Notes (Signed)
Subjective:  Patient ID: Edwin Moreno, male    DOB: Jun 12, 1976  Age: 43 y.o. MRN: [id number]  CC: Medical Management of Chronic Issues   HPI ROOK MAUE presents for follow-up of elevated cholesterol. Doing well without complaints on current medication. Denies side effects of statin including myalgia and arthralgia and nausea. Also in today for liver function testing. Currently no chest pain, shortness of breath or other cardiovascular related symptoms noted.  Patient in for follow-up of GERD. Currently asymptomatic taking  PPI daily. There is no chest pain or heartburn. No hematemesis and no melena. No dysphagia or choking. Onset is remote. Progression is stable. Complicating factors, none.   Patient also tells me that he has an occasional flare of his gout but it is adequately treated with the indomethacin.  He feels that the allopurinol is working out quite well for him. History [place] has a past medical history of GERD (gastroesophageal reflux disease), Gout, and Hyperlipidemia.   He has no past surgical history on file.   His family history is not on file.He reports that he has never smoked. He has never used smokeless tobacco. He reports current alcohol use. He reports that he does not use drugs.  Current Outpatient Medications on File Prior to Visit  Medication Sig Dispense Refill  . Cholecalciferol (VITAMIN D3 PO) Take by mouth.    . Omega-3 Fatty Acids (FISH OIL PO) Take by mouth.     No current facility-administered medications on file prior to visit.     ROS Review of Systems  Constitutional: Negative.   HENT: Negative.   Eyes: Negative for visual disturbance.  Respiratory: Negative for cough and shortness of breath.   Cardiovascular: Negative for chest pain and leg swelling.  Gastrointestinal: Negative for abdominal pain, diarrhea, nausea and vomiting.  Genitourinary: Negative for difficulty urinating.  Musculoskeletal: Negative for arthralgias and myalgias.   Skin: Negative for rash.  Neurological: Negative for headaches.  Psychiatric/Behavioral: Negative for sleep disturbance.    Objective:  BP 137/84   Pulse 67   Temp 99.2 F (37.3 C) (Oral)   Ht _0  (1.88 m)   Wt 250 lb (113.4 kg)   BMI 32.10 kg/m   BP Readings from Last 3 Encounters:  12/19/18 137/84  04/18/18 136/80  05/24/17 128/85    Wt Readings from Last 3 Encounters:  12/19/18 250 lb (113.4 kg)  04/18/18 255 lb (115.7 kg)  05/24/17 260 lb (117.9 kg)     Physical Exam Constitutional:      General: He is not in acute distress.    Appearance: He is well-developed.  HENT:     Head: Normocephalic and atraumatic.     Right Ear: External ear normal.     Left Ear: External ear normal.     Nose: Nose normal.  Eyes:     Conjunctiva/sclera: Conjunctivae normal.     Pupils: Pupils are equal, round, and reactive to light.  Neck:     Musculoskeletal: Normal range of motion and neck supple.  Cardiovascular:     Rate and Rhythm: Normal rate and regular rhythm.     Heart sounds: Normal heart sounds. No murmur.  Pulmonary:     Effort: Pulmonary effort is normal. No respiratory distress.     Breath sounds: Normal breath sounds. No wheezing or rales.  Abdominal:     Palpations: Abdomen is soft.     Tenderness: There is no abdominal tenderness.  Musculoskeletal: Normal range of motion.  Skin:  General: Skin is warm and dry.  Neurological:     Mental Status: He is alert and oriented to person, place, and time.     Deep Tendon Reflexes: Reflexes are normal and symmetric.  Psychiatric:        Behavior: Behavior normal.        Thought Content: Thought content normal.        Judgment: Judgment normal.     No results found for: HGBA1C  Lab Results  Component Value Date   WBC 5.5 09/18/2015   HGB 15.8 09/18/2015   HCT 46.2 09/18/2015   PLT 290 09/18/2015   GLUCOSE 81 05/24/2017   CHOL 192 05/24/2017   TRIG 169 (H) 05/24/2017   HDL 37 (L) 05/24/2017   LDLCALC  121 (H) 05/24/2017   ALT 26 05/24/2017   AST 25 05/24/2017   NA 143 05/24/2017   K 4.3 05/24/2017   CL 101 05/24/2017   CREATININE 1.02 05/24/2017   BUN 12 05/24/2017   CO2 24 05/24/2017   TSH 1.940 04/30/2015    No results found.  Assessment & Plan:   Gay was seen today for medical management of chronic issues.  Diagnoses and all orders for this visit:  Hypertriglyceridemia  Hyperlipidemia, unspecified hyperlipidemia type -     CBC with Differential/Platelet -     CMP14+EGFR -     Lipid panel  Gout, unspecified cause, unspecified chronicity, unspecified site -     Uric acid  Hyperlipemia -     pravastatin (PRAVACHOL) 40 MG tablet; TAKE (1/2) TABLET DAILY.  Gastroesophageal reflux disease with esophagitis -     omeprazole (PRILOSEC) 40 MG capsule; TAKE (1) CAPSULE DAILY  Other orders -     indomethacin (INDOCIN) 50 MG capsule; TAKE  (1)  CAPSULE  TWICE DAILY. -     allopurinol (ZYLOPRIM) 300 MG tablet; Take 1 tablet (300 mg total) by mouth daily.   I have discontinued Kizer Nobbe. Fassler's fenofibrate and predniSONE. I have also changed his allopurinol. Additionally, I am having him maintain his Cholecalciferol (VITAMIN D3 PO), Omega-3 Fatty Acids (FISH OIL PO), pravastatin, omeprazole, and indomethacin.  Meds ordered this encounter  Medications  . pravastatin (PRAVACHOL) 40 MG tablet    Sig: TAKE (1/2) TABLET DAILY.    Dispense:  45 tablet    Refill:  1  . omeprazole (PRILOSEC) 40 MG capsule    Sig: TAKE (1) CAPSULE DAILY    Dispense:  90 capsule    Refill:  1    $  . indomethacin (INDOCIN) 50 MG capsule    Sig: TAKE  (1)  CAPSULE  TWICE DAILY.    Dispense:  180 capsule    Refill:  1  . allopurinol (ZYLOPRIM) 300 MG tablet    Sig: Take 1 tablet (300 mg total) by mouth daily.    Dispense:  90 tablet    Refill:  1    $     Follow-up: Return in about 6 months (around 06/20/2019) for Wellness.  [name], M.D.

## 2018-12-20 ENCOUNTER — Telehealth: Payer: Self-pay | Admitting: *Deleted

## 2018-12-20 LAB — CBC WITH DIFFERENTIAL/PLATELET
Basophils Absolute: 0 10*3/uL (ref 0.0–0.2)
Basos: 1 %
EOS (ABSOLUTE): 0.1 10*3/uL (ref 0.0–0.4)
Eos: 1 %
Hematocrit: 46.4 % (ref 37.5–51.0)
Hemoglobin: 15.9 g/dL (ref 13.0–17.7)
Immature Grans (Abs): 0 10*3/uL (ref 0.0–0.1)
Immature Granulocytes: 1 %
Lymphocytes Absolute: 1.4 10*3/uL (ref 0.7–3.1)
Lymphs: 27 %
MCH: 31.7 pg (ref 26.6–33.0)
MCHC: 34.3 g/dL (ref 31.5–35.7)
MCV: 92 fL (ref 79–97)
Monocytes Absolute: 0.3 10*3/uL (ref 0.1–0.9)
Monocytes: 6 %
Neutrophils Absolute: 3.6 10*3/uL (ref 1.4–7.0)
Neutrophils: 64 %
Platelets: 267 10*3/uL (ref 150–450)
RBC: 5.02 x10E6/uL (ref 4.14–5.80)
RDW: 12.5 % (ref 11.6–15.4)
WBC: 5.4 10*3/uL (ref 3.4–10.8)

## 2018-12-20 LAB — CMP14+EGFR
ALT: 34 IU/L (ref 0–44)
AST: 33 IU/L (ref 0–40)
Albumin/Globulin Ratio: 2.6 — ABNORMAL HIGH (ref 1.2–2.2)
Albumin: 5 g/dL (ref 4.0–5.0)
Alkaline Phosphatase: 71 IU/L (ref 39–117)
BUN/Creatinine Ratio: 14 (ref 9–20)
BUN: 15 mg/dL (ref 6–24)
Bilirubin Total: 1.1 mg/dL (ref 0.0–1.2)
CO2: 25 mmol/L (ref 20–29)
Calcium: 9.8 mg/dL (ref 8.7–10.2)
Chloride: 102 mmol/L (ref 96–106)
Creatinine, Ser: 1.04 mg/dL (ref 0.76–1.27)
GFR calc Af Amer: 102 mL/min/{1.73_m2} (ref >59)
GFR calc non Af Amer: 88 mL/min/{1.73_m2} (ref >59)
Globulin, Total: 1.9 g/dL (ref 1.5–4.5)
Glucose: 87 mg/dL (ref 65–99)
Potassium: 4.4 mmol/L (ref 3.5–5.2)
Sodium: 141 mmol/L (ref 134–144)
Total Protein: 6.9 g/dL (ref 6.0–8.5)

## 2018-12-20 LAB — LIPID PANEL
Chol/HDL Ratio: 5.1 ratio — ABNORMAL HIGH (ref 0.0–5.0)
Cholesterol, Total: 190 mg/dL (ref 100–199)
HDL: 37 mg/dL — ABNORMAL LOW (ref >39)
LDL Calculated: 109 mg/dL — ABNORMAL HIGH (ref 0–99)
Triglycerides: 222 mg/dL — ABNORMAL HIGH (ref 0–149)
VLDL Cholesterol Cal: 44 mg/dL — ABNORMAL HIGH (ref 5–40)

## 2018-12-20 LAB — URIC ACID: Uric Acid: 5.8 mg/dL (ref 3.7–8.6)

## 2018-12-20 NOTE — Addendum Note (Signed)
Addended by: Shelbie Ammons on: 12/20/2018 02:59 PM   Modules accepted: Orders

## 2018-12-20 NOTE — Telephone Encounter (Signed)
Aware.  He must come for blood work before ten in  morning for a PSA test.

## 2018-12-20 NOTE — Telephone Encounter (Signed)
Please ask lab to add free and total testosterone. Thanks, WS

## 2018-12-20 NOTE — Telephone Encounter (Signed)
Pt wanted Testosterone checked with labs drawn yesterday Can this be added?

## 2019-02-08 ENCOUNTER — Other Ambulatory Visit: Payer: BC Managed Care – PPO

## 2019-02-08 ENCOUNTER — Other Ambulatory Visit: Payer: Self-pay

## 2019-02-08 DIAGNOSIS — R5383 Other fatigue: Secondary | ICD-10-CM | POA: Diagnosis not present

## 2019-02-11 LAB — TESTOSTERONE,FREE AND TOTAL
Testosterone, Free: 5.7 pg/mL — ABNORMAL LOW (ref 6.8–21.5)
Testosterone: 231 ng/dL — ABNORMAL LOW (ref 264–916)

## 2019-02-11 NOTE — Progress Notes (Signed)
Hello [place],  Your lab result is normal and/or stable.Some minor variations that are not significant are commonly marked abnormal, but do not represent any medical problem for you.  Best regards, [name], M.D.

## 2019-07-13 ENCOUNTER — Other Ambulatory Visit: Payer: Self-pay | Admitting: Family Medicine

## 2019-07-13 DIAGNOSIS — E785 Hyperlipidemia, unspecified: Secondary | ICD-10-CM

## 2019-07-13 DIAGNOSIS — K2100 Gastro-esophageal reflux disease with esophagitis, without bleeding: Secondary | ICD-10-CM

## 2019-07-14 DIAGNOSIS — Z23 Encounter for immunization: Secondary | ICD-10-CM | POA: Diagnosis not present

## 2019-08-02 DIAGNOSIS — Z20828 Contact with and (suspected) exposure to other viral communicable diseases: Secondary | ICD-10-CM | POA: Diagnosis not present

## 2019-08-11 DIAGNOSIS — Z23 Encounter for immunization: Secondary | ICD-10-CM | POA: Diagnosis not present

## 2019-10-09 DIAGNOSIS — Z302 Encounter for sterilization: Secondary | ICD-10-CM | POA: Diagnosis not present

## 2019-10-30 ENCOUNTER — Other Ambulatory Visit: Payer: Self-pay | Admitting: Family Medicine

## 2019-10-30 DIAGNOSIS — E785 Hyperlipidemia, unspecified: Secondary | ICD-10-CM

## 2019-10-30 DIAGNOSIS — K2100 Gastro-esophageal reflux disease with esophagitis, without bleeding: Secondary | ICD-10-CM

## 2019-10-31 NOTE — Telephone Encounter (Signed)
Stacks. NTBS 30 days given 07/16/19 LOV 12/19/18

## 2019-11-02 ENCOUNTER — Telehealth: Payer: Self-pay | Admitting: Family Medicine

## 2019-11-02 DIAGNOSIS — E785 Hyperlipidemia, unspecified: Secondary | ICD-10-CM

## 2019-11-02 DIAGNOSIS — K2100 Gastro-esophageal reflux disease with esophagitis, without bleeding: Secondary | ICD-10-CM

## 2019-11-02 MED ORDER — OMEPRAZOLE 40 MG PO CPDR: DELAYED_RELEASE_CAPSULE | ORAL | 0 refills | Status: DC

## 2019-11-02 MED ORDER — ALLOPURINOL 300 MG PO TABS: 300.0000 mg | ORAL_TABLET | Freq: Every day | ORAL | 0 refills | Status: DC

## 2019-11-02 MED ORDER — PRAVASTATIN SODIUM 40 MG PO TABS: ORAL_TABLET | ORAL | 0 refills | Status: DC

## 2019-11-02 NOTE — Telephone Encounter (Signed)
Patient has apt 5/3 - one month supply of medications has been sent. Patient aware

## 2019-11-02 NOTE — Telephone Encounter (Signed)
  Prescription Request  11/02/2019  What is the name of the medication or equipment? pravastatin (PRAVACHOL) 40 MG tablet [Pharmacy Med Name: PRAVASTATIN SODIUM 40 MG TAB omeprazole (PRILOSEC) 40 MG capsule [Pharmacy Med Name: OMEPRAZOLE DR 40 MG CAPSULE] allopurinol (ZYLOPRIM) 300 MG tablet [Pharmacy Med Name: [place]  Have you contacted your pharmacy to request a refill? (if applicable) YES  Which pharmacy would you like this sent to? [facility] has televisit on 11/05/19 he is completely out wants to know if he can get a week supply refill   Patient notified that their request is being sent to the clinical staff for review and that they should receive a response within 2 business days.

## 2019-11-05 ENCOUNTER — Ambulatory Visit (INDEPENDENT_AMBULATORY_CARE_PROVIDER_SITE_OTHER): Payer: BC Managed Care – PPO | Admitting: Family Medicine

## 2019-11-05 ENCOUNTER — Encounter: Payer: Self-pay | Admitting: Family Medicine

## 2019-11-05 DIAGNOSIS — M109 Gout, unspecified: Secondary | ICD-10-CM | POA: Diagnosis not present

## 2019-11-05 DIAGNOSIS — R7989 Other specified abnormal findings of blood chemistry: Secondary | ICD-10-CM | POA: Diagnosis not present

## 2019-11-05 DIAGNOSIS — E781 Pure hyperglyceridemia: Secondary | ICD-10-CM

## 2019-11-05 DIAGNOSIS — K2100 Gastro-esophageal reflux disease with esophagitis, without bleeding: Secondary | ICD-10-CM | POA: Diagnosis not present

## 2019-11-05 MED ORDER — PRAVASTATIN SODIUM 40 MG PO TABS: ORAL_TABLET | ORAL | 1 refills | Status: AC

## 2019-11-05 MED ORDER — INDOMETHACIN 50 MG PO CAPS: ORAL_CAPSULE | ORAL | 1 refills | Status: DC

## 2019-11-05 MED ORDER — OMEPRAZOLE 40 MG PO CPDR: DELAYED_RELEASE_CAPSULE | ORAL | 1 refills | Status: DC

## 2019-11-05 MED ORDER — ALLOPURINOL 300 MG PO TABS: 300.0000 mg | ORAL_TABLET | Freq: Every day | ORAL | 1 refills | Status: DC

## 2019-11-05 NOTE — Progress Notes (Signed)
  Subjective:    Patient ID: Edwin Moreno, male    DOB: 08/08/1975, 43 y.o.   MRN: [id number]   HPI: Edwin Moreno is a 43 y.o. male presenting for patient in for follow-up of elevated cholesterol. Doing well without complaints on current medication. Denies side effects of statin including myalgia and arthralgia and nausea. Also in today for liver function testing. Currently no chest pain, shortness of breath or other cardiovascular related symptoms noted.  Patient in for follow-up of GERD. Currently asymptomatic taking  PPI daily. There is no chest pain or heartburn. No hematemesis and no melena. No dysphagia or choking. Onset is remote. Progression is stable. Complicating factors, none.   Patient also follow-up for gout.  Has only rare flares now with use of allopurinol daily.  Indomethacin takes care of flares when they do occur.  Cannot remember when the last one was.  Patient also had a low testosterone level last summer.  He is experiencing no issues regarding that.  He is not having erectile dysfunction or loss of libido.  Energy and muscle tone seem to be adequate.  He is actually in the gym working out on a regular basis.  Workouts are satisfactory.  Depression screen PHQ 2/9 12/19/2018 04/18/2018 05/24/2017 02/27/2016 09/18/2015  Decreased Interest 0 0 0 0 0  Down, Depressed, Hopeless 0 0 0 0 0  PHQ - 2 Score 0 0 0 0 0     Relevant past medical, surgical, family and social history reviewed and updated as indicated.  Interim medical history since our last visit reviewed. Allergies and medications reviewed and updated.  ROS:  Review of Systems  Constitutional: Negative.   HENT: Negative.   Eyes: Negative for visual disturbance.  Respiratory: Negative for cough and shortness of breath.   Cardiovascular: Negative for chest pain and leg swelling.  Gastrointestinal: Negative for abdominal pain, diarrhea, nausea and vomiting.  Genitourinary: Negative for difficulty urinating.    Musculoskeletal: Negative for arthralgias and myalgias.  Skin: Negative for rash.  Neurological: Negative for headaches.  Psychiatric/Behavioral: Negative for sleep disturbance.     Social History   Tobacco Use  Smoking Status Never Smoker  Smokeless Tobacco Never Used       Objective:     Wt Readings from Last 3 Encounters:  12/19/18 250 lb (113.4 kg)  04/18/18 255 lb (115.7 kg)  05/24/17 260 lb (117.9 kg)     Exam deferred. Pt. [place] due to COVID 19. Phone visit performed.   Assessment & Plan:   1. Gastroesophageal reflux disease with esophagitis without hemorrhage   2. Gout, unspecified cause, unspecified chronicity, unspecified site   3. Hypertriglyceridemia   4. Low testosterone in male     Meds ordered this encounter  Medications  . allopurinol (ZYLOPRIM) 300 MG tablet    Sig: Take 1 tablet (300 mg total) by mouth daily. (Needs to be seen before next refill)    Dispense:  90 tablet    Refill:  1  . indomethacin (INDOCIN) 50 MG capsule    Sig: TAKE  (1)  CAPSULE  TWICE DAILY.    Dispense:  180 capsule    Refill:  1  . omeprazole (PRILOSEC) 40 MG capsule    Sig: TAKE (1) CAPSULE DAILY (Needs to be seen before next refill)    Dispense:  90 capsule    Refill:  1  . pravastatin (PRAVACHOL) 40 MG tablet    Sig: TAKE (1/2) TABLET DAILY. (Needs to be   seen before next refill)    Dispense:  90 tablet    Refill:  1    Orders Placed This Encounter  Procedures  . CBC with Differential/Platelet  . CMP14+EGFR    Order Specific Question:   Has the patient fasted?    Answer:   Yes  . Lipid panel    Order Specific Question:   Has the patient fasted?    Answer:   Yes  . Testosterone,Free and Total  . Uric acid      Diagnoses and all orders for this visit:  Gastroesophageal reflux disease with esophagitis without hemorrhage -     omeprazole (PRILOSEC) 40 MG capsule; TAKE (1) CAPSULE DAILY (Needs to be seen before next refill) -     CBC with  Differential/Platelet -     CMP14+EGFR  Gout, unspecified cause, unspecified chronicity, unspecified site -     allopurinol (ZYLOPRIM) 300 MG tablet; Take 1 tablet (300 mg total) by mouth daily. (Needs to be seen before next refill) -     indomethacin (INDOCIN) 50 MG capsule; TAKE  (1)  CAPSULE  TWICE DAILY. -     CBC with Differential/Platelet -     CMP14+EGFR -     Uric acid  Hypertriglyceridemia -     pravastatin (PRAVACHOL) 40 MG tablet; TAKE (1/2) TABLET DAILY. (Needs to be seen before next refill) -     CBC with Differential/Platelet -     CMP14+EGFR -     Lipid panel  Low testosterone in male -     CBC with Differential/Platelet -     CMP14+EGFR -     Testosterone,Free and Total    Virtual Visit via telephone Note  I discussed the limitations, risks, security and privacy concerns of performing an evaluation and management service by telephone and the availability of in person appointments. The patient was identified with two identifiers. Pt.expressed understanding and agreed to proceed. Pt. Is at home. Dr. Livia Snellen is in his office.  Follow Up Instructions:   I discussed the assessment and treatment plan with the patient. The patient was provided an opportunity to ask questions and all were answered. The patient agreed with the plan and demonstrated an understanding of the instructions.   The patient was advised to call back or seek an in-person evaluation if the symptoms worsen or if the condition fails to improve as anticipated.   Total minutes including chart review and phone contact time: 16   Follow up plan: No follow-ups on file.  [name], MD [place]

## 2019-11-09 DIAGNOSIS — Z302 Encounter for sterilization: Secondary | ICD-10-CM | POA: Diagnosis not present

## 2020-03-26 DIAGNOSIS — J189 Pneumonia, unspecified organism: Secondary | ICD-10-CM | POA: Diagnosis not present

## 2020-07-18 ENCOUNTER — Other Ambulatory Visit: Payer: Self-pay | Admitting: Family Medicine

## 2020-07-18 DIAGNOSIS — K2100 Gastro-esophageal reflux disease with esophagitis, without bleeding: Secondary | ICD-10-CM

## 2020-07-18 DIAGNOSIS — M109 Gout, unspecified: Secondary | ICD-10-CM

## 2021-01-02 ENCOUNTER — Other Ambulatory Visit (HOSPITAL_COMMUNITY): Payer: Self-pay | Admitting: Neurology

## 2021-01-02 DIAGNOSIS — R569 Unspecified convulsions: Secondary | ICD-10-CM

## 2021-01-10 ENCOUNTER — Other Ambulatory Visit: Payer: Self-pay | Admitting: Neurology

## 2021-01-10 DIAGNOSIS — R42 Dizziness and giddiness: Secondary | ICD-10-CM

## 2021-01-24 ENCOUNTER — Other Ambulatory Visit: Payer: Self-pay

## 2021-01-24 ENCOUNTER — Ambulatory Visit
Admission: RE | Admit: 2021-01-24 | Discharge: 2021-01-24 | Disposition: A | Discharge status: Home / Self Care | Payer: BC Managed Care – PPO | Source: Ambulatory Visit | Attending: Neurology | Admitting: Neurology

## 2021-01-24 DIAGNOSIS — R42 Dizziness and giddiness: Secondary | ICD-10-CM

## 2021-01-24 MED ORDER — GADOBENATE DIMEGLUMINE 529 MG/ML IV SOLN
20.0000 mL | Freq: Once | INTRAVENOUS | Status: AC | PRN
  Administered 2021-01-24: 20 mL via INTRAVENOUS

## 2021-02-02 ENCOUNTER — Other Ambulatory Visit: Payer: Self-pay | Admitting: Neurological Surgery

## 2021-02-06 NOTE — Progress Notes (Signed)
Surgical Instructions    Your procedure is scheduled on Friday, August 12th, 2022.   Report to [facility] Main Entrance "A" at 05:30 A.M., then check in with the Admitting office.  Call this number if you have problems the morning of surgery:  [phone]   If you have any questions prior to your surgery date call [phone]: Open Monday-Friday 8am-4pm    Remember:  Do not eat after midnight the night before your surgery  You may drink clear liquids until 04:30 the morning of your surgery.   Clear liquids allowed are: Water, Non-Citrus Juices (without pulp), Carbonated Beverages, Clear Tea, Black Coffee Only, and Gatorade    Take these medicines the morning of surgery with A SIP OF WATER:  allopurinol (ZYLOPRIM) cetirizine (ZYRTEC) omeprazole (PRILOSEC) pravastatin (PRAVACHOL)  As of today, STOP taking any Aspirin (unless otherwise instructed by your surgeon) Aleve, Naproxen, Ibuprofen, Motrin, Advil, Goody's, BC's, all herbal medications, fish oil, and all vitamins.          Do not wear jewelry  Do not wear lotions, powders, colognes, or deodorant. Men may shave face and neck. Do not bring valuables to the hospital. DO Not wear nail polish, gel polish, artificial nails, or any other type of covering on natural nails including finger and toenails. If patients have artificial nails, gel coating, etc. that need to be removed by a nail salon please have this removed prior to surgery or surgery may need to be canceled/delayed if the surgeon/ anesthesia feels like the patient is unable to be adequately monitored.             [facility] is not responsible for any belongings or valuables.  Do NOT Smoke (Tobacco/Vaping) or drink Alcohol 24 hours prior to your procedure If you use a CPAP at night, you may bring all equipment for your overnight stay.   Contacts, glasses, dentures or bridgework may not be worn into surgery, please bring cases for these belongings   For patients  admitted to the hospital, discharge time will be determined by your treatment team.   Patients discharged the day of surgery will not be allowed to drive home, and someone needs to stay with them for 24 hours.  ONLY 1 SUPPORT PERSON MAY BE PRESENT WHILE YOU ARE IN SURGERY. IF YOU ARE TO BE ADMITTED ONCE YOU ARE IN YOUR ROOM YOU WILL BE ALLOWED TWO (2) VISITORS.  Minor children may have two parents present. Special consideration for safety and communication needs will be reviewed on a case by case basis.  Special instructions:    Oral Hygiene is also important to reduce your risk of infection.  Remember - BRUSH YOUR TEETH THE MORNING OF SURGERY WITH YOUR REGULAR TOOTHPASTE   [facility]- Preparing For Surgery  Before surgery, you can play an important role. Because skin is not sterile, your skin needs to be as free of germs as possible. You can reduce the number of germs on your skin by washing with CHG (chlorahexidine gluconate) Soap before surgery.  CHG is an antiseptic cleaner which kills germs and bonds with the skin to continue killing germs even after washing.     Please do not use if you have an allergy to CHG or antibacterial soaps. If your skin becomes reddened/irritated stop using the CHG.  Do not shave (including legs and underarms) for at least 48 hours prior to first CHG shower. It is OK to shave your face.  Please follow these instructions carefully.  Shower the NIGHT BEFORE SURGERY and the MORNING OF SURGERY with CHG Soap.   If you chose to wash your hair, wash your hair first as usual with your normal shampoo. After you shampoo, rinse your hair and body thoroughly to remove the shampoo.  Then [employer] and genitals (private parts) with your normal soap and rinse thoroughly to remove soap.  After that Use CHG Soap as you would any other liquid soap. You can apply CHG directly to the skin and wash gently with a scrungie or a clean washcloth.   Apply the CHG Soap to your  body ONLY FROM THE NECK DOWN.  Do not use on open wounds or open sores. Avoid contact with your eyes, ears, mouth and genitals (private parts). Wash Face and genitals (private parts)  with your normal soap.   Wash thoroughly, paying special attention to the area where your surgery will be performed.  Thoroughly rinse your body with warm water from the neck down.  DO NOT shower/wash with your normal soap after using and rinsing off the CHG Soap.  Pat yourself dry with a CLEAN TOWEL.  Wear CLEAN PAJAMAS to bed the night before surgery  Place CLEAN SHEETS on your bed the night before your surgery  DO NOT SLEEP WITH PETS.   Day of Surgery:  Take a shower with CHG soap. Wear Clean/Comfortable clothing the morning of surgery Do not apply any deodorants/lotions.   Remember to brush your teeth WITH YOUR REGULAR TOOTHPASTE.   Please read over the following fact sheets that you were given.

## 2021-02-09 ENCOUNTER — Encounter (HOSPITAL_COMMUNITY): Payer: Self-pay

## 2021-02-09 ENCOUNTER — Encounter (HOSPITAL_COMMUNITY)
Admission: RE | Admit: 2021-02-09 | Discharge: 2021-02-09 | Disposition: A | Discharge status: Home / Self Care | Payer: BC Managed Care – PPO | Source: Ambulatory Visit | Attending: Neurological Surgery | Admitting: Neurological Surgery

## 2021-02-09 ENCOUNTER — Other Ambulatory Visit: Payer: Self-pay

## 2021-02-09 DIAGNOSIS — Z01812 Encounter for preprocedural laboratory examination: Secondary | ICD-10-CM | POA: Insufficient documentation

## 2021-02-09 DIAGNOSIS — Z20822 Contact with and (suspected) exposure to covid-19: Secondary | ICD-10-CM | POA: Insufficient documentation

## 2021-02-09 HISTORY — DX: Unspecified osteoarthritis, unspecified site: M19.90

## 2021-02-09 HISTORY — DX: Unspecified asthma, uncomplicated: J45.909

## 2021-02-09 LAB — TYPE AND SCREEN
ABO/RH(D): A POS
Antibody Screen: NEGATIVE

## 2021-02-09 LAB — BASIC METABOLIC PANEL
Anion gap: 7 (ref 5–15)
BUN: 11 mg/dL (ref 6–20)
CO2: 28 mmol/L (ref 22–32)
Calcium: 9.4 mg/dL (ref 8.9–10.3)
Chloride: 103 mmol/L (ref 98–111)
Creatinine, Ser: 1.03 mg/dL (ref 0.61–1.24)
GFR, Estimated: >60 mL/min (ref >60)
Glucose, Bld: 92 mg/dL (ref 70–99)
Potassium: 4 mmol/L (ref 3.5–5.1)
Sodium: 138 mmol/L (ref 135–145)

## 2021-02-09 LAB — CBC
HCT: 44 % (ref 39.0–52.0)
Hemoglobin: 14.8 g/dL (ref 13.0–17.0)
MCH: 32.3 pg (ref 26.0–34.0)
MCHC: 33.6 g/dL (ref 30.0–36.0)
MCV: 96.1 fL (ref 80.0–100.0)
Platelets: 242 10*3/uL (ref 150–400)
RBC: 4.58 MIL/uL (ref 4.22–5.81)
RDW: 11.8 % (ref 11.5–15.5)
WBC: 5.2 10*3/uL (ref 4.0–10.5)
nRBC: 0 % (ref 0.0–0.2)

## 2021-02-09 LAB — SARS CORONAVIRUS 2 (TAT 6-24 HRS): SARS Coronavirus 2: NEGATIVE

## 2021-02-09 NOTE — Progress Notes (Signed)
PCP - Sharlyne Cai, DNP w/ [facility]; formerly [name], MD w/ [facility] Cardiologist - Denies  PPM/ICD - Denies  Chest x-ray - N/A EKG - N/A Stress Test - Denies ECHO - Denies Cardiac Cath - Denies  Sleep Study - Denies  Pt denies being diabetic.  Blood Thinner Instructions: N/A Aspirin Instructions: N/A  ERAS Protcol - Yes PRE-SURGERY Ensure or G2- Not ordered  COVID TEST- 02/09/21; results pending   Anesthesia review: No  Patient denies shortness of breath, fever, cough and chest pain at PAT appointment   All instructions explained to the patient, with a verbal understanding of the material. Patient agrees to go over the instructions while at home for a better understanding. The opportunity to ask questions was provided.

## 2021-02-13 ENCOUNTER — Inpatient Hospital Stay (HOSPITAL_COMMUNITY): Payer: BC Managed Care – PPO

## 2021-02-13 ENCOUNTER — Inpatient Hospital Stay (HOSPITAL_COMMUNITY): Payer: BC Managed Care – PPO | Admitting: Certified Registered"

## 2021-02-13 ENCOUNTER — Inpatient Hospital Stay (HOSPITAL_COMMUNITY)
Admission: RE | Admit: 2021-02-13 | Discharge: 2021-02-15 | DRG: 027 | Disposition: A | Discharge status: Home / Self Care | Payer: BC Managed Care – PPO | Attending: Neurological Surgery | Admitting: Neurological Surgery

## 2021-02-13 ENCOUNTER — Encounter (HOSPITAL_COMMUNITY): Payer: Self-pay | Admitting: Neurological Surgery

## 2021-02-13 ENCOUNTER — Encounter (HOSPITAL_COMMUNITY)
Admission: RE | Disposition: A | Discharge status: Home / Self Care | Payer: Self-pay | Source: Home / Self Care | Attending: Neurological Surgery

## 2021-02-13 DIAGNOSIS — E785 Hyperlipidemia, unspecified: Secondary | ICD-10-CM | POA: Diagnosis present

## 2021-02-13 DIAGNOSIS — J45909 Unspecified asthma, uncomplicated: Secondary | ICD-10-CM | POA: Diagnosis present

## 2021-02-13 DIAGNOSIS — D496 Neoplasm of unspecified behavior of brain: Secondary | ICD-10-CM

## 2021-02-13 DIAGNOSIS — C719 Malignant neoplasm of brain, unspecified: Secondary | ICD-10-CM | POA: Diagnosis present

## 2021-02-13 DIAGNOSIS — M109 Gout, unspecified: Secondary | ICD-10-CM | POA: Diagnosis present

## 2021-02-13 DIAGNOSIS — K219 Gastro-esophageal reflux disease without esophagitis: Secondary | ICD-10-CM | POA: Diagnosis present

## 2021-02-13 DIAGNOSIS — Z87891 Personal history of nicotine dependence: Secondary | ICD-10-CM | POA: Diagnosis not present

## 2021-02-13 DIAGNOSIS — Z1509 Genetic susceptibility to other malignant neoplasm: Secondary | ICD-10-CM | POA: Diagnosis present

## 2021-02-13 DIAGNOSIS — G9389 Other specified disorders of brain: Secondary | ICD-10-CM

## 2021-02-13 HISTORY — PX: CRANIOTOMY: SHX93

## 2021-02-13 HISTORY — PX: APPLICATION OF CRANIAL NAVIGATION: SHX6578

## 2021-02-13 HISTORY — DX: Other specified postprocedural states: Z98.890

## 2021-02-13 LAB — CBC
HCT: 42 % (ref 39.0–52.0)
Hemoglobin: 14.5 g/dL (ref 13.0–17.0)
MCH: 33.4 pg (ref 26.0–34.0)
MCHC: 34.5 g/dL (ref 30.0–36.0)
MCV: 96.8 fL (ref 80.0–100.0)
Platelets: 207 10*3/uL (ref 150–400)
RBC: 4.34 MIL/uL (ref 4.22–5.81)
RDW: 11.9 % (ref 11.5–15.5)
WBC: 6.4 10*3/uL (ref 4.0–10.5)
nRBC: 0 % (ref 0.0–0.2)

## 2021-02-13 LAB — ABO/RH: ABO/RH(D): A POS

## 2021-02-13 LAB — MRSA NEXT GEN BY PCR, NASAL: MRSA by PCR Next Gen: NOT DETECTED

## 2021-02-13 LAB — CREATININE, SERUM
Creatinine, Ser: 1.12 mg/dL (ref 0.61–1.24)
GFR, Estimated: >60 mL/min (ref >60)

## 2021-02-13 SURGERY — CRANIOTOMY TUMOR EXCISION
Anesthesia: General

## 2021-02-13 MED ORDER — PROMETHAZINE HCL 25 MG PO TABS
12.5000 mg | ORAL_TABLET | ORAL | Status: DC | PRN
  Filled 2021-02-13: qty 1

## 2021-02-13 MED ORDER — HEMOSTATIC AGENTS (NO CHARGE) OPTIME
TOPICAL | Status: DC | PRN
  Administered 2021-02-13: 1 via TOPICAL

## 2021-02-13 MED ORDER — CEFAZOLIN SODIUM-DEXTROSE 2-4 GM/100ML-% IV SOLN
2.0000 g | Freq: Three times a day (TID) | INTRAVENOUS | Status: AC
Start: 2021-02-13 — End: 2021-02-14
  Administered 2021-02-13 (×2): 2 g via INTRAVENOUS
  Filled 2021-02-13 (×2): qty 100

## 2021-02-13 MED ORDER — DOCUSATE SODIUM 100 MG PO CAPS
100.0000 mg | ORAL_CAPSULE | Freq: Two times a day (BID) | ORAL | Status: DC
  Administered 2021-02-13 – 2021-02-15 (×4): 100 mg via ORAL
  Filled 2021-02-13 (×4): qty 1

## 2021-02-13 MED ORDER — PANTOPRAZOLE SODIUM 40 MG PO TBEC
40.0000 mg | DELAYED_RELEASE_TABLET | Freq: Every day | ORAL | Status: DC
  Administered 2021-02-14 – 2021-02-15 (×2): 40 mg via ORAL
  Filled 2021-02-13 (×3): qty 1

## 2021-02-13 MED ORDER — PROPOFOL 10 MG/ML IV BOLUS
INTRAVENOUS | Status: AC
  Filled 2021-02-13: qty 40

## 2021-02-13 MED ORDER — 0.9 % SODIUM CHLORIDE (POUR BTL) OPTIME
TOPICAL | Status: DC | PRN
  Administered 2021-02-13: 3000 mL

## 2021-02-13 MED ORDER — LABETALOL HCL 5 MG/ML IV SOLN: 10.0000 mg | INTRAVENOUS | Status: DC | PRN

## 2021-02-13 MED ORDER — THROMBIN 20000 UNITS EX SOLR
CUTANEOUS | Status: AC
  Filled 2021-02-13: qty 20000

## 2021-02-13 MED ORDER — MEPERIDINE HCL 25 MG/ML IJ SOLN: 6.2500 mg | INTRAMUSCULAR | Status: DC | PRN

## 2021-02-13 MED ORDER — OXYCODONE HCL 5 MG PO TABS: 5.0000 mg | ORAL_TABLET | Freq: Once | ORAL | Status: DC | PRN

## 2021-02-13 MED ORDER — PROMETHAZINE HCL 25 MG/ML IJ SOLN
INTRAMUSCULAR | Status: AC
  Filled 2021-02-13: qty 1

## 2021-02-13 MED ORDER — HEPARIN SODIUM (PORCINE) 5000 UNIT/ML IJ SOLN
5000.0000 [IU] | Freq: Three times a day (TID) | INTRAMUSCULAR | Status: DC
  Administered 2021-02-15: 5000 [IU] via SUBCUTANEOUS
  Filled 2021-02-13: qty 1

## 2021-02-13 MED ORDER — HYDROCODONE-ACETAMINOPHEN 5-325 MG PO TABS
1.0000 | ORAL_TABLET | ORAL | Status: DC | PRN
  Administered 2021-02-13 – 2021-02-15 (×5): 1 via ORAL
  Filled 2021-02-13 (×5): qty 1

## 2021-02-13 MED ORDER — SODIUM CHLORIDE 0.9 % IV SOLN
0.0500 ug/kg/min | INTRAVENOUS | Status: AC
  Administered 2021-02-13: .2 ug/kg/min via INTRAVENOUS
  Filled 2021-02-13: qty 5000

## 2021-02-13 MED ORDER — CEFAZOLIN SODIUM-DEXTROSE 2-4 GM/100ML-% IV SOLN
2.0000 g | INTRAVENOUS | Status: AC
  Administered 2021-02-13: 2 g via INTRAVENOUS
  Filled 2021-02-13: qty 100

## 2021-02-13 MED ORDER — THROMBIN 20000 UNITS EX SOLR
CUTANEOUS | Status: DC | PRN
  Administered 2021-02-13: 20 mL via TOPICAL

## 2021-02-13 MED ORDER — ONDANSETRON HCL 4 MG/2ML IJ SOLN
INTRAMUSCULAR | Status: DC | PRN
  Administered 2021-02-13: 4 mg via INTRAVENOUS

## 2021-02-13 MED ORDER — CHLORHEXIDINE GLUCONATE CLOTH 2 % EX PADS: 6.0000 | MEDICATED_PAD | Freq: Once | CUTANEOUS | Status: DC

## 2021-02-13 MED ORDER — PROPOFOL 10 MG/ML IV BOLUS
INTRAVENOUS | Status: DC | PRN
  Administered 2021-02-13: 200 mg via INTRAVENOUS
  Administered 2021-02-13: 30 mg via INTRAVENOUS

## 2021-02-13 MED ORDER — PRAVASTATIN SODIUM 40 MG PO TABS
20.0000 mg | ORAL_TABLET | Freq: Every day | ORAL | Status: DC
  Administered 2021-02-13 – 2021-02-15 (×3): 20 mg via ORAL
  Filled 2021-02-13 (×3): qty 2
  Filled 2021-02-13: qty 1

## 2021-02-13 MED ORDER — CHLORHEXIDINE GLUCONATE CLOTH 2 % EX PADS
6.0000 | MEDICATED_PAD | Freq: Every day | CUTANEOUS | Status: DC
  Administered 2021-02-13 – 2021-02-15 (×2): 6 via TOPICAL

## 2021-02-13 MED ORDER — THROMBIN 5000 UNITS EX SOLR
CUTANEOUS | Status: AC
  Filled 2021-02-13: qty 5000

## 2021-02-13 MED ORDER — ACETAMINOPHEN 325 MG PO TABS: 650.0000 mg | ORAL_TABLET | ORAL | Status: DC | PRN

## 2021-02-13 MED ORDER — MIDAZOLAM HCL 2 MG/2ML IJ SOLN
INTRAMUSCULAR | Status: AC
  Filled 2021-02-13: qty 2

## 2021-02-13 MED ORDER — HYDROMORPHONE HCL 1 MG/ML IJ SOLN
0.5000 mg | INTRAMUSCULAR | Status: DC | PRN
  Administered 2021-02-13 – 2021-02-15 (×5): 0.5 mg via INTRAVENOUS
  Filled 2021-02-13: qty 0.5
  Filled 2021-02-13: qty 1
  Filled 2021-02-13: qty 0.5
  Filled 2021-02-13 (×2): qty 1

## 2021-02-13 MED ORDER — OXYCODONE HCL 5 MG/5ML PO SOLN
5.0000 mg | Freq: Once | ORAL | Status: DC | PRN
Start: 2021-02-13 — End: 2021-02-13

## 2021-02-13 MED ORDER — THROMBIN 5000 UNITS EX SOLR
OROMUCOSAL | Status: DC | PRN
  Administered 2021-02-13: 5 mL via TOPICAL

## 2021-02-13 MED ORDER — POLYETHYLENE GLYCOL 3350 17 G PO PACK
17.0000 g | PACK | Freq: Every day | ORAL | Status: DC | PRN
  Filled 2021-02-13: qty 1

## 2021-02-13 MED ORDER — LACTATED RINGERS IV SOLN: INTRAVENOUS | Status: DC

## 2021-02-13 MED ORDER — PHENYLEPHRINE HCL-NACL 20-0.9 MG/250ML-% IV SOLN
INTRAVENOUS | Status: DC | PRN
  Administered 2021-02-13: 30 ug/min via INTRAVENOUS

## 2021-02-13 MED ORDER — ONDANSETRON HCL 4 MG/2ML IJ SOLN
4.0000 mg | INTRAMUSCULAR | Status: DC | PRN
  Administered 2021-02-13: 4 mg via INTRAVENOUS
  Filled 2021-02-13: qty 2

## 2021-02-13 MED ORDER — SODIUM CHLORIDE 0.9 % IV SOLN: INTRAVENOUS | Status: DC

## 2021-02-13 MED ORDER — SODIUM CHLORIDE 0.9 % IV SOLN: INTRAVENOUS | Status: DC | PRN

## 2021-02-13 MED ORDER — SODIUM CHLORIDE 0.9 % IV SOLN
0.0125 ug/kg/min | INTRAVENOUS | Status: AC
  Administered 2021-02-13: .2 ug/kg/min via INTRAVENOUS
  Filled 2021-02-13: qty 2000

## 2021-02-13 MED ORDER — ACETAMINOPHEN 10 MG/ML IV SOLN
INTRAVENOUS | Status: AC
  Filled 2021-02-13: qty 100

## 2021-02-13 MED ORDER — GADOBUTROL 1 MMOL/ML IV SOLN
10.0000 mL | Freq: Once | INTRAVENOUS | Status: AC | PRN
  Administered 2021-02-13: 10 mL via INTRAVENOUS

## 2021-02-13 MED ORDER — LIDOCAINE HCL (CARDIAC) PF 100 MG/5ML IV SOSY
PREFILLED_SYRINGE | INTRAVENOUS | Status: DC | PRN
  Administered 2021-02-13: 60 mg via INTRATRACHEAL

## 2021-02-13 MED ORDER — ACETAMINOPHEN 500 MG PO TABS: 1000.0000 mg | ORAL_TABLET | Freq: Once | ORAL | Status: DC

## 2021-02-13 MED ORDER — ALBUMIN HUMAN 5 % IV SOLN: INTRAVENOUS | Status: DC | PRN

## 2021-02-13 MED ORDER — PROMETHAZINE HCL 25 MG/ML IJ SOLN
6.2500 mg | INTRAMUSCULAR | Status: DC | PRN
  Administered 2021-02-13: 6.25 mg via INTRAVENOUS

## 2021-02-13 MED ORDER — ROCURONIUM BROMIDE 100 MG/10ML IV SOLN
INTRAVENOUS | Status: DC | PRN
  Administered 2021-02-13: 50 mg via INTRAVENOUS
  Administered 2021-02-13: 100 mg via INTRAVENOUS
  Administered 2021-02-13 (×2): 50 mg via INTRAVENOUS
  Administered 2021-02-13: 20 mg via INTRAVENOUS

## 2021-02-13 MED ORDER — LIDOCAINE-EPINEPHRINE 1 %-1:100000 IJ SOLN
INTRAMUSCULAR | Status: DC | PRN
  Administered 2021-02-13: 7 mL

## 2021-02-13 MED ORDER — ACETAMINOPHEN 10 MG/ML IV SOLN
1000.0000 mg | Freq: Once | INTRAVENOUS | Status: AC
  Administered 2021-02-13: 1000 mg via INTRAVENOUS

## 2021-02-13 MED ORDER — DEXAMETHASONE SODIUM PHOSPHATE 10 MG/ML IJ SOLN
INTRAMUSCULAR | Status: DC | PRN
  Administered 2021-02-13: 10 mg via INTRAVENOUS

## 2021-02-13 MED ORDER — MIDAZOLAM HCL 2 MG/2ML IJ SOLN
INTRAMUSCULAR | Status: DC | PRN
  Administered 2021-02-13: 2 mg via INTRAVENOUS

## 2021-02-13 MED ORDER — BACITRACIN ZINC 500 UNIT/GM EX OINT
TOPICAL_OINTMENT | CUTANEOUS | Status: AC
  Filled 2021-02-13: qty 28.35

## 2021-02-13 MED ORDER — FENTANYL CITRATE (PF) 250 MCG/5ML IJ SOLN
INTRAMUSCULAR | Status: AC
  Filled 2021-02-13: qty 5

## 2021-02-13 MED ORDER — LORATADINE 10 MG PO TABS
10.0000 mg | ORAL_TABLET | Freq: Every day | ORAL | Status: DC
  Administered 2021-02-13 – 2021-02-15 (×3): 10 mg via ORAL
  Filled 2021-02-13 (×3): qty 1

## 2021-02-13 MED ORDER — BACITRACIN ZINC 500 UNIT/GM EX OINT
TOPICAL_OINTMENT | CUTANEOUS | Status: DC | PRN
  Administered 2021-02-13: 1 via TOPICAL

## 2021-02-13 MED ORDER — HYDROMORPHONE HCL 1 MG/ML IJ SOLN
INTRAMUSCULAR | Status: AC
  Filled 2021-02-13: qty 1

## 2021-02-13 MED ORDER — HYDROMORPHONE HCL 1 MG/ML IJ SOLN
0.2500 mg | INTRAMUSCULAR | Status: DC | PRN
  Administered 2021-02-13: 0.25 mg via INTRAVENOUS

## 2021-02-13 MED ORDER — ORAL CARE MOUTH RINSE: 15.0000 mL | Freq: Once | OROMUCOSAL | Status: AC

## 2021-02-13 MED ORDER — CHLORHEXIDINE GLUCONATE 0.12 % MT SOLN
15.0000 mL | Freq: Once | OROMUCOSAL | Status: AC
  Administered 2021-02-13: 15 mL via OROMUCOSAL
  Filled 2021-02-13: qty 15

## 2021-02-13 MED ORDER — PHENYLEPHRINE HCL (PRESSORS) 10 MG/ML IV SOLN
INTRAVENOUS | Status: DC | PRN
  Administered 2021-02-13: 80 ug via INTRAVENOUS

## 2021-02-13 MED ORDER — OXYCODONE HCL 5 MG PO TABS
5.0000 mg | ORAL_TABLET | ORAL | Status: DC | PRN
  Administered 2021-02-13 – 2021-02-15 (×6): 10 mg via ORAL
  Filled 2021-02-13 (×8): qty 2

## 2021-02-13 MED ORDER — LIDOCAINE-EPINEPHRINE 1 %-1:100000 IJ SOLN
INTRAMUSCULAR | Status: AC
  Filled 2021-02-13: qty 1

## 2021-02-13 MED ORDER — ALLOPURINOL 100 MG PO TABS
100.0000 mg | ORAL_TABLET | Freq: Every day | ORAL | Status: DC
  Administered 2021-02-13 – 2021-02-15 (×3): 100 mg via ORAL
  Filled 2021-02-13 (×3): qty 1

## 2021-02-13 MED ORDER — LORAZEPAM 2 MG/ML IJ SOLN
1.0000 mg | Freq: Once | INTRAMUSCULAR | Status: AC
  Administered 2021-02-13: 1 mg via INTRAVENOUS
  Filled 2021-02-13: qty 1

## 2021-02-13 MED ORDER — ACETAMINOPHEN 650 MG RE SUPP: 650.0000 mg | RECTAL | Status: DC | PRN

## 2021-02-13 MED ORDER — FENTANYL CITRATE (PF) 250 MCG/5ML IJ SOLN
INTRAMUSCULAR | Status: DC | PRN
  Administered 2021-02-13 (×2): 100 ug via INTRAVENOUS
  Administered 2021-02-13: 50 ug via INTRAVENOUS

## 2021-02-13 MED ORDER — ONDANSETRON HCL 4 MG PO TABS: 4.0000 mg | ORAL_TABLET | ORAL | Status: DC | PRN

## 2021-02-13 SURGICAL SUPPLY — 96 items
ADH SKN CLS APL DERMABOND .7 (GAUZE/BANDAGES/DRESSINGS) ×2
APL SKNCLS STERI-STRIP NONHPOA (GAUZE/BANDAGES/DRESSINGS)
BAG COUNTER SPONGE SURGICOUNT (BAG) ×4 IMPLANT
BAG SPNG CNTER NS LX DISP (BAG) ×4
BAND INSRT 18 STRL LF DISP RB (MISCELLANEOUS) ×4
BAND RUBBER #18 3X1/16 STRL (MISCELLANEOUS) ×2 IMPLANT
BENZOIN TINCTURE PRP APPL 2/3 (GAUZE/BANDAGES/DRESSINGS) IMPLANT
BLADE CLIPPER SURG (BLADE) ×2 IMPLANT
BLADE SAW GIGLI 16 STRL (MISCELLANEOUS) IMPLANT
BLADE SURG 15 STRL LF DISP TIS (BLADE) IMPLANT
BLADE SURG 15 STRL SS (BLADE)
BNDG CMPR 75X41 PLY HI ABS (GAUZE/BANDAGES/DRESSINGS)
BNDG GAUZE ELAST 4 BULKY (GAUZE/BANDAGES/DRESSINGS) IMPLANT
BNDG STRETCH 4X75 STRL LF (GAUZE/BANDAGES/DRESSINGS) IMPLANT
BUR ACORN 9.0 PRECISION (BURR) ×3 IMPLANT
BUR ROUND FLUTED 4 SOFT TCH (BURR) ×1 IMPLANT
BUR SPIRAL ROUTER 2.3 (BUR) ×3 IMPLANT
CANISTER SUCT 3000ML PPV (MISCELLANEOUS) ×6 IMPLANT
CATH VENTRIC 35X38 W/TROCAR LG (CATHETERS) IMPLANT
CLIP VESOCCLUDE MED 6/CT (CLIP) IMPLANT
CNTNR URN SCR LID CUP LEK RST (MISCELLANEOUS) ×2 IMPLANT
CONT SPEC 4OZ STRL OR WHT (MISCELLANEOUS) ×6
COVER BURR HOLE 7 (Orthopedic Implant) ×3 IMPLANT
COVER MAYO STAND STRL (DRAPES) IMPLANT
DECANTER SPIKE VIAL GLASS SM (MISCELLANEOUS) ×3 IMPLANT
DERMABOND ADVANCED (GAUZE/BANDAGES/DRESSINGS) ×1
DERMABOND ADVANCED .7 DNX12 (GAUZE/BANDAGES/DRESSINGS) IMPLANT
DRAIN SUBARACHNOID (WOUND CARE) IMPLANT
DRAPE HALF SHEET 40X57 (DRAPES) ×3 IMPLANT
DRAPE MICROSCOPE LEICA (MISCELLANEOUS) ×1 IMPLANT
DRAPE NEUROLOGICAL W/INCISE (DRAPES) ×3 IMPLANT
DRAPE STERI IOBAN 125X83 (DRAPES) IMPLANT
DRAPE SURG 17X23 STRL (DRAPES) IMPLANT
DRAPE WARM FLUID 44X44 (DRAPES) ×3 IMPLANT
DRSG ADAPTIC 3X8 NADH LF (GAUZE/BANDAGES/DRESSINGS) IMPLANT
DRSG TELFA 3X8 NADH (GAUZE/BANDAGES/DRESSINGS) IMPLANT
DURAPREP 6ML APPLICATOR 50/CS (WOUND CARE) ×3 IMPLANT
ELECT REM PT RETURN 9FT ADLT (ELECTROSURGICAL) ×3
ELECTRODE REM PT RTRN 9FT ADLT (ELECTROSURGICAL) ×2 IMPLANT
EVACUATOR 1/8 PVC DRAIN (DRAIN) IMPLANT
EVACUATOR SILICONE 100CC (DRAIN) IMPLANT
FORCEPS BIPOLAR SPETZLER 8 1.0 (NEUROSURGERY SUPPLIES) ×3 IMPLANT
GAUZE 4X4 16PLY ~~LOC~~+RFID DBL (SPONGE) ×2 IMPLANT
GAUZE SPONGE 4X4 12PLY STRL (GAUZE/BANDAGES/DRESSINGS) IMPLANT
GLOVE ECLIPSE 9.0 STRL (GLOVE) ×1 IMPLANT
GLOVE EXAM NITRILE LRG STRL (GLOVE) IMPLANT
GLOVE EXAM NITRILE XS STR PU (GLOVE) IMPLANT
GLOVE SURG ENC MOIS LTX SZ7 (GLOVE) ×1 IMPLANT
GLOVE SURG LTX SZ7.5 (GLOVE) ×3 IMPLANT
GLOVE SURG UNDER POLY LF SZ6.5 (GLOVE) ×3 IMPLANT
GLOVE SURG UNDER POLY LF SZ7 (GLOVE) ×1 IMPLANT
GLOVE SURG UNDER POLY LF SZ7.5 (GLOVE) ×5 IMPLANT
GOWN STRL REUS W/ TWL LRG LVL3 (GOWN DISPOSABLE) ×4 IMPLANT
GOWN STRL REUS W/ TWL XL LVL3 (GOWN DISPOSABLE) IMPLANT
GOWN STRL REUS W/TWL 2XL LVL3 (GOWN DISPOSABLE) IMPLANT
GOWN STRL REUS W/TWL LRG LVL3 (GOWN DISPOSABLE) ×9
GOWN STRL REUS W/TWL XL LVL3 (GOWN DISPOSABLE) ×3
HEMOSTAT POWDER KIT SURGIFOAM (HEMOSTASIS) ×3 IMPLANT
HEMOSTAT SURGICEL 2X14 (HEMOSTASIS) ×3 IMPLANT
IV NS 1000ML (IV SOLUTION)
IV NS 1000ML BAXH (IV SOLUTION) ×2 IMPLANT
KIT BASIN OR (CUSTOM PROCEDURE TRAY) ×3 IMPLANT
KIT DRAIN CSF ACCUDRAIN (MISCELLANEOUS) IMPLANT
KIT TURNOVER KIT B (KITS) ×3 IMPLANT
MARKER SPHERE PSV REFLC 13MM (MARKER) ×8 IMPLANT
NDL SPNL 18GX3.5 QUINCKE PK (NEEDLE) IMPLANT
NEEDLE HYPO 22GX1.5 SAFETY (NEEDLE) ×3 IMPLANT
NEEDLE SPNL 18GX3.5 QUINCKE PK (NEEDLE) IMPLANT
NS IRRIG 1000ML POUR BTL (IV SOLUTION) ×9 IMPLANT
PACK CRANIOTOMY CUSTOM (CUSTOM PROCEDURE TRAY) ×3 IMPLANT
PAD DRESSING TELFA 3X8 NADH (GAUZE/BANDAGES/DRESSINGS) IMPLANT
PATTIES SURGICAL .25X.25 (GAUZE/BANDAGES/DRESSINGS) IMPLANT
PATTIES SURGICAL .5 X.5 (GAUZE/BANDAGES/DRESSINGS) IMPLANT
PATTIES SURGICAL .5 X3 (DISPOSABLE) IMPLANT
PATTIES SURGICAL 1/4 X 3 (GAUZE/BANDAGES/DRESSINGS) IMPLANT
PATTIES SURGICAL 1X1 (DISPOSABLE) IMPLANT
PIN MAYFIELD SKULL DISP (PIN) ×3 IMPLANT
SCREW UNIII AXS SD 1.5X4 (Screw) ×12 IMPLANT
SPECIMEN JAR SMALL (MISCELLANEOUS) IMPLANT
SPONGE NEURO XRAY DETECT 1X3 (DISPOSABLE) IMPLANT
SPONGE SURGIFOAM ABS GEL 100 (HEMOSTASIS) ×3 IMPLANT
STAPLER VISISTAT 35W (STAPLE) ×3 IMPLANT
SUT ETHILON 3 0 FSL (SUTURE) IMPLANT
SUT ETHILON 3 0 PS 1 (SUTURE) IMPLANT
SUT MNCRL AB 3-0 PS2 18 (SUTURE) ×1 IMPLANT
SUT MON AB 3-0 SH 27 (SUTURE)
SUT MON AB 3-0 SH27 (SUTURE) IMPLANT
SUT NURALON 4 0 TR CR/8 (SUTURE) ×7 IMPLANT
SUT SILK 0 TIES 10X30 (SUTURE) IMPLANT
SUT VIC AB 2-0 CP2 18 (SUTURE) ×3 IMPLANT
TOWEL GREEN STERILE (TOWEL DISPOSABLE) ×3 IMPLANT
TOWEL GREEN STERILE FF (TOWEL DISPOSABLE) ×3 IMPLANT
TRAY FOLEY MTR SLVR 16FR STAT (SET/KITS/TRAYS/PACK) ×3 IMPLANT
TUBE CONNECTING 12X1/4 (SUCTIONS) ×3 IMPLANT
UNDERPAD 30X36 HEAVY ABSORB (UNDERPADS AND DIAPERS) ×2 IMPLANT
WATER STERILE IRR 1000ML POUR (IV SOLUTION) ×3 IMPLANT

## 2021-02-13 NOTE — Progress Notes (Signed)
Pt expressed he needs something for anxiety for MRI.  Dr. Zada Finders paged and 1 mg IV Ativan ordered once for MRI.

## 2021-02-13 NOTE — Transfer of Care (Signed)
Immediate Anesthesia Transfer of Care Note  Patient: Edwin Moreno  Procedure(s) Performed: Left Craniotomy for tumor resection with brainlab (Left) APPLICATION OF CRANIAL NAVIGATION  Patient Location: PACU  Anesthesia Type:General  Level of Consciousness: awake, alert  and oriented  Airway & Oxygen Therapy: Patient Spontanous Breathing and Patient connected to nasal cannula oxygen  Post-op Assessment: Report given to RN and Post -op Vital signs reviewed and stable  Post vital signs: Reviewed and stable  Last Vitals:  Vitals Value Taken Time  BP 145/93 02/13/21 1120  Temp 36.8 C 02/13/21 1120  Pulse 79 02/13/21 1127  Resp 17 02/13/21 1127  SpO2 96 % 02/13/21 1127  Vitals shown include unvalidated device data.  Last Pain:  Vitals:   02/13/21 1120  TempSrc:   PainSc: 7       Patients Stated Pain Goal: 4 ([id number])  Complications: No notable events documented.

## 2021-02-13 NOTE — Anesthesia Procedure Notes (Signed)
Procedure Name: Intubation Date/Time: 02/13/2021 7:40 AM Performed by: Pervis Hocking, DO Pre-anesthesia Checklist: Patient identified, Emergency Drugs available, Suction available and Patient being monitored Patient Re-evaluated:Patient Re-evaluated prior to induction Oxygen Delivery Method: Circle system utilized Preoxygenation: Pre-oxygenation with 100% oxygen Induction Type: IV induction Ventilation: Mask ventilation without difficulty and Oral airway inserted - appropriate to patient size Laryngoscope Size: Mac and 4 Grade View: Grade II Tube type: Oral Tube size: 7.5 mm Number of attempts: 1 Airway Equipment and Method: Stylet and Oral airway Placement Confirmation: ETT inserted through vocal cords under direct vision, positive ETCO2 and breath sounds checked- equal and bilateral Secured at: 24 cm Tube secured with: Tape Dental Injury: Teeth and Oropharynx as per pre-operative assessment

## 2021-02-13 NOTE — Progress Notes (Signed)
MRI stated unable to get pt down and will call when ready.

## 2021-02-13 NOTE — Anesthesia Preprocedure Evaluation (Signed)
Anesthesia Evaluation  Patient identified by MRN, date of birth, ID band Patient awake    Reviewed: Allergy & Precautions, NPO status , Patient's Chart, lab work & pertinent test results  Airway Mallampati: II  TM Distance: >3 FB Neck ROM: Full    Dental no notable dental hx.    Pulmonary asthma ,    Pulmonary exam normal breath sounds clear to auscultation       Cardiovascular negative cardio ROS Normal cardiovascular exam Rhythm:Regular Rate:Normal     Neuro/Psych 6 cm mildly enhancing left frontoparietal mass most consistent with a primary CNS neoplasm. negative psych ROS   GI/Hepatic Neg liver ROS, GERD  ,  Endo/Other  Obesity BMI 33  Renal/GU negative Renal ROS  negative genitourinary   Musculoskeletal  (+) Arthritis , Osteoarthritis,    Abdominal   Peds  Hematology negative hematology ROS (+) hct 44   Anesthesia Other Findings   Reproductive/Obstetrics negative OB ROS                             Anesthesia Physical Anesthesia Plan  ASA: 2  Anesthesia Plan: General   Post-op Pain Management:    Induction: Intravenous  PONV Risk Score and Plan: 2 and Ondansetron, Dexamethasone, Midazolam and Treatment may vary due to age or medical condition  Airway Management Planned: Oral ETT  Additional Equipment: Arterial line  Intra-op Plan:   Post-operative Plan: Extubation in OR  Informed Consent: I have reviewed the patients History and Physical, chart, labs and discussed the procedure including the risks, benefits and alternatives for the proposed anesthesia with the patient or authorized representative who has indicated his/her understanding and acceptance.     Dental advisory given  Plan Discussed with: CRNA  Anesthesia Plan Comments:         Anesthesia Quick Evaluation

## 2021-02-13 NOTE — Anesthesia Postprocedure Evaluation (Signed)
Anesthesia Post Note  Patient: Edwin Moreno  Procedure(s) Performed: Left Craniotomy for tumor resection with brainlab (Left) APPLICATION OF CRANIAL NAVIGATION     Patient location during evaluation: PACU Anesthesia Type: General Level of consciousness: awake and alert, oriented and patient cooperative Pain management: pain level controlled Vital Signs Assessment: post-procedure vital signs reviewed and stable Respiratory status: spontaneous breathing, nonlabored ventilation and respiratory function stable Cardiovascular status: blood pressure returned to baseline and stable Postop Assessment: no apparent nausea or vomiting Anesthetic complications: no   No notable events documented.  Last Vitals:  Vitals:   02/13/21 0545 02/13/21 1120  BP: (!) 145/83 (!) 145/93  Pulse: 65 82  Resp: 17 13  Temp: 36.7 C 36.8 C  SpO2: 98% 98%    Last Pain:  Vitals:   02/13/21 1120  TempSrc:   PainSc: [place]

## 2021-02-13 NOTE — Op Note (Signed)
PATIENT: Edwin Moreno  DAY OF SURGERY: 02/13/21   PRE-OPERATIVE DIAGNOSIS:  Brain tumor   POST-OPERATIVE DIAGNOSIS:  Same   PROCEDURE:  Left craniotomy for tumor resection, use of frameless stereotaxy   SURGEON:  Surgeon(s) and Role:    Edwin Part, MD - Primary    Earnie Larsson, MD - Assisting   ANESTHESIA: ETGA   BRIEF HISTORY: This is a 45 year old man who presented with episodes of dizziness with atypical sensation / aura. The patient was found to have a left frontoparietal enhancing mass. I therefore recommended surgical resection. This was discussed with the patient as well as risks, benefits, and alternatives and wished to proceed with surgery.   OPERATIVE DETAIL: The patient was taken to the operating room and placed on the OR table in the supine position. A formal time out was performed with two patient identifiers and confirmed the operative site. Anesthesia was induced by the anesthesia team. The [place] head holder was applied to the head and a registration array was attached to the [place]. This was co-registered with the patient's preoperative imaging, the fit appeared to be acceptable. Using frameless stereotaxy, the operative trajectory was planned and the incision was marked. The area was then prepped and draped in a sterile fashion.  A longitudinal, linear incision was placed in the left frontoparietal region. A craniotomy flap was turned, localized by using stereotactic guidance. The dura was opened and flapped medially, landmarks were confirmed visually as well as stereotactically. A corticotomy was created, a trajectory was created towards the bulk of the tumor, the mass was identified, and a biopsy was taken and sent for frozen. The radiographic margins were identified and the mass was debulked, dissected circumferentially and resected then sent to pathology for analysis. There were areas of calcification, but it was difficult to discern from normal brain except at  the depth of the resection bed, I had to rely on stereotaxy more than usual to determine margins. I asked Dr. Annette Stable to scrub in to help with retraction of the ventral cortex for the depth of the resection to keep it from blocking visualization of the depth of the resection bed and to minimize any pressure on eloquent cortex. Hemostasis was obtained and confirmed, the wound was copiously irrigated, the proximal resection bed was lined with surgicel, the dura was closed with suture, and the bone flap was replaced with titanium plates and screws.   All instrument and sponge counts were correct, the incision was then closed in layers. The patient was then returned to anesthesia for emergence. No apparent complications at the completion of the procedure.   EBL:  274m   DRAINS: none   SPECIMENS: Left parietal brain tumor   TJudith Part MD 02/13/21 7:08 AM

## 2021-02-13 NOTE — H&P (Signed)
Surgical H&P Update  HPI: 45 y.o. man with dizziness and some aura-like symptoms, workup revealed a left parietal enhancing mass. No changes in health since he was last seen. Still wishes to proceed with surgery.  PMHx:  Past Medical History:  Diagnosis Date   Arthritis    Asthma    CHILDHOOD HX   GERD (gastroesophageal reflux disease)    Gout    H/O removal of cyst    hip as a child   Hyperlipidemia    FamHx: History reviewed. No pertinent family history. SocHx:  reports that he has never smoked. He has quit using smokeless tobacco.  His smokeless tobacco use included chew. He reports current alcohol use. He reports that he does not use drugs.  Physical Exam: AOx3, PERRL, FS, TM  Strength 5/5 x4, SILTx4  Assesment/Plan: 45 y.o. man with enhancing left parietal mass, here for craniotomy and resection. Risks, benefits, and alternatives discussed and the patient would like to continue with surgery.  -OR today -4N ICU post-op  Judith Part, MD 02/13/21 7:06 AM

## 2021-02-14 MED ORDER — NUTRISOURCE FIBER PO PACK
1.0000 | PACK | Freq: Two times a day (BID) | ORAL | Status: DC
  Administered 2021-02-15: 1 via ORAL
  Filled 2021-02-14 (×3): qty 1

## 2021-02-14 MED ORDER — PSYLLIUM 95 % PO PACK
1.0000 | PACK | Freq: Every day | ORAL | Status: DC
  Administered 2021-02-14 – 2021-02-15 (×2): 1 via ORAL
  Filled 2021-02-14 (×3): qty 1

## 2021-02-14 MED ORDER — NUTRISOURCE FIBER PO PACK
1.0000 | PACK | Freq: Two times a day (BID) | ORAL | Status: DC
  Filled 2021-02-14: qty 1

## 2021-02-14 NOTE — Progress Notes (Signed)
OT Cancellation Note  Patient Details Name: Edwin Moreno MRN: [id number] DOB: 06/21/1976   Cancelled Treatment:    Reason Eval/Treat Not Completed: Other (comment) (Being transferred from ICU to floor. Will follow up later time.)  [facility], OT/L   Acute OT Clinical Specialist [place] Pager [phone] Office [phone]  02/14/2021, 4:02 PM

## 2021-02-14 NOTE — Plan of Care (Signed)

## 2021-02-14 NOTE — Progress Notes (Signed)
Postop day 1.  Patient overall doing well.  Complains of some mild headache.  Still has a little bit of numbness and tingling in his right leg.  Patient has been up ambulating and seems comfortable walking.  He is afebrile.  His vital signs are stable.  His wound is clean and dry.  Motor examination 5/5 bilaterally in his upper extremities.  She was some mild right lower extremity weakness.  Status post resection of left posterior frontal mass worrisome for GBM.  Continue current medications and mobilize with transfer to floor today.  Likely home tomorrow.

## 2021-02-14 NOTE — Evaluation (Signed)
Physical Therapy Evaluation Patient Details Name: Edwin Moreno MRN: [id number] DOB: [birth year] Today's Date: 02/14/2021   History of Present Illness  Pt is a 45 y.o. M who presents with dizziness and aura-like symptoms, workup revealed a left parietal enhancing mass. Now s/p L craniotomy for tumor resection 02/13/2021. No significant PMH.  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 Pt reports good pain control and denies nausea. Sensation decreased to light touch distal to right knee. Pt ambulating 200 feet with a walker at a supervision level. Focused on gait training with right heel strike and equal step lengths, which pt was able to correct with good carryover. Suspect excellent progress; recommend OPPT to address gait training.     Follow Up Recommendations Outpatient PT    Equipment Recommendations  Rolling walker with 5" wheels    Recommendations for Other Services       Precautions / Restrictions Precautions Precautions: None Restrictions Weight Bearing Restrictions: No      Mobility  Bed Mobility               General bed mobility comments: OOB in bathroom upon entry    Transfers Overall transfer level: Modified independent Equipment used: Rolling walker (2 wheeled)                Ambulation/Gait Ambulation/Gait assistance: Supervision Gait Distance (Feet): 200 Feet Assistive device: Rolling walker (2 wheeled) Gait Pattern/deviations: Step-through pattern;Decreased step length - left;Decreased stance time - right;Decreased dorsiflexion - right;Narrow base of support Gait velocity: decreased   General Gait Details: Cues for right heel strike, decreased step length, upright posture, walker use. Supervision for safety  Stairs            Wheelchair Mobility    Modified Rankin (Stroke Patients Only)       Balance Overall balance assessment: Mild deficits observed, not formally tested                                            Pertinent Vitals/Pain Pain Assessment: Faces Faces Pain Scale: Hurts a little bit Pain Location: mild headache Pain Descriptors / Indicators: Headache Pain Intervention(s): Monitored during session    Home Living Family/patient expects to be discharged to:: Private residence Living Arrangements: Spouse/significant other;Children Available Help at Discharge: Family Type of Home: House Home Access: Stairs to enter   Technical brewer of Steps: 1 Home Layout: One level Home Equipment: Civil engineer, contracting - built in      Prior Function Level of Independence: Independent         Comments: works as Engineer, structural - desk job     Journalist, newspaper        Extremity/Trunk Assessment   Upper Extremity Assessment Upper Extremity Assessment: Overall WFL for tasks assessed    Lower Extremity Assessment Lower Extremity Assessment: RLE deficits/detail RLE Deficits / Details: Strength 5/5 RLE Sensation: decreased light touch (distally)    Cervical / Trunk Assessment Cervical / Trunk Assessment: Normal  Communication   Communication: No difficulties  Cognition Arousal/Alertness: Awake/alert Behavior During Therapy: WFL for tasks assessed/performed Overall Cognitive Status: Within Functional Limits for tasks assessed  General Comments      Exercises     Assessment/Plan    PT Assessment Patient needs continued PT services  PT Problem List Decreased balance;Decreased mobility;Impaired sensation       PT Treatment Interventions DME instruction;Gait training;Functional mobility training;Stair training;Therapeutic activities;Therapeutic exercise;Balance training;Patient/family education    PT Goals (Current goals can be found in the Care Plan section)  Acute Rehab PT Goals Patient Stated Goal: return to baseline PT Goal Formulation: With patient Time For Goal Achievement:  02/28/21 Potential to Achieve Goals: Good    Frequency Min 3X/week   Barriers to discharge        Co-evaluation               AM-PAC PT "6 Clicks" Mobility  Outcome Measure Help needed turning from your back to your side while in a flat bed without using bedrails?: None Help needed moving from lying on your back to sitting on the side of a flat bed without using bedrails?: None Help needed moving to and from a bed to a chair (including a wheelchair)?: None Help needed standing up from a chair using your arms (e.g., wheelchair or bedside chair)?: None Help needed to walk in hospital room?: A Little Help needed climbing 3-5 steps with a railing? : A Little 6 Click Score: 22    End of Session Equipment Utilized During Treatment: Gait belt Activity Tolerance: Patient tolerated treatment well Patient left: in bed;with call bell/phone within reach   PT Visit Diagnosis: Other abnormalities of gait and mobility (R26.89)    Time: [id number] PT Time Calculation (min) (ACUTE ONLY): 16 min   Charges:   PT Evaluation $PT Eval Low Complexity: [place], PT, DPT Acute Rehabilitation Services Pager [phone] Office [phone]   Deno Etienne 02/14/2021, 11:40 AM

## 2021-02-15 LAB — POCT I-STAT 7, (LYTES, BLD GAS, ICA,H+H)
Acid-base deficit: 3 mmol/L — ABNORMAL HIGH (ref 0.0–2.0)
Bicarbonate: 24.2 mmol/L (ref 20.0–28.0)
Calcium, Ion: 1.19 mmol/L (ref 1.15–1.40)
HCT: 41 % (ref 39.0–52.0)
Hemoglobin: 13.9 g/dL (ref 13.0–17.0)
O2 Saturation: 100 %
Potassium: 5.1 mmol/L (ref 3.5–5.1)
Sodium: 141 mmol/L (ref 135–145)
TCO2: 26 mmol/L (ref 22–32)
pCO2 arterial: 50.7 mmHg — ABNORMAL HIGH (ref 32.0–48.0)
pH, Arterial: 7.287 — ABNORMAL LOW (ref 7.350–7.450)
pO2, Arterial: 267 mmHg — ABNORMAL HIGH (ref 83.0–108.0)

## 2021-02-15 MED ORDER — METHYLPREDNISOLONE 4 MG PO TBPK: ORAL_TABLET | ORAL | 0 refills | Status: DC

## 2021-02-15 MED ORDER — HYDROCODONE-ACETAMINOPHEN 5-325 MG PO TABS: 1.0000 | ORAL_TABLET | ORAL | 0 refills | Status: DC | PRN

## 2021-02-15 NOTE — Discharge Instructions (Signed)

## 2021-02-15 NOTE — TOC Transition Note (Signed)
Transition of Care [facility]) - CM/SW Discharge Note   Patient Details  Name: JOHNMICHAEL HELMAN MRN: [id number] Date of Birth: 1976-02-06  [facility]) CM/SW Contact:  Bartholomew Crews, RN Phone Number: [phone] 02/15/2021, 11:13 AM   Clinical Narrative:     Notified by nursing of patient to transition home today. Noted PT recommendations for RW and outpatient rehab. Referral to [facility] to deliver RW to the room. Referral to outpatient rehab at [facility]. No further TOC needs identified.   Final next level of care: OP Rehab Barriers to Discharge: No Barriers Identified   Patient Goals and CMS Choice        Discharge Placement                       Discharge Plan and Services                DME Arranged: Walker rolling DME Agency: [facility] Date DME Agency Contacted: 02/15/21 Time DME Agency Contacted: [id number] Representative spoke with at DME Agency: [place] ([place]) Interventions     Readmission Risk Interventions No flowsheet data found.

## 2021-02-15 NOTE — Evaluation (Signed)
Occupational Therapy Evaluation Patient Details Name: Edwin Moreno MRN: [id number] DOB: 02/21/1976 Today's Date: 02/15/2021    History of Present Illness Pt is a 45 y.o. M who presents with dizziness and aura-like symptoms, workup revealed a left parietal enhancing mass. Now s/p L craniotomy for tumor resection 02/13/2021. No significant PMH.   Clinical Impression   Pt PTA: Engineer, structural with desk job; independent. Pt currently, light sensitive, but able to manage in hallway with bright lights. Pt performing OOB ADL and simulating walk-in shower with a glass door transfer. Pt endorses multiple family members available as needed. Pt with no UB weakness, sensation intact and memory/cognition appears, WFLs. Pt does not require continued OT skilled services .OT signing off.    Follow Up Recommendations  No OT follow up    Equipment Recommendations  None recommended by OT    Recommendations for Other Services       Precautions / Restrictions Precautions Precautions: None Restrictions Weight Bearing Restrictions: No      Mobility Bed Mobility Overal bed mobility: Modified Independent             General bed mobility comments: no physical assist to EOB    Transfers Overall transfer level: Modified independent Equipment used: Rolling walker (2 wheeled)             General transfer comment: able to avoid obstacles    Balance Overall balance assessment: Mild deficits observed, not formally tested                                         ADL either performed or assessed with clinical judgement   ADL Overall ADL's : Modified independent                                       General ADL Comments: Pt light sensitive, but able to manage in hallway with bright lights. Pt performing OOB ADL and simulating walk-in shower with a glass door transfer. Pt endorses multiple family members available as needed. Pt with no UB weakness, sensation  intact and memory/cognition appears, WFLs.     Vision Baseline Vision/History: Wears glasses Wears Glasses: At all times Patient Visual Report: No change from baseline Vision Assessment?: Yes Eye Alignment: Within Functional Limits Ocular Range of Motion: Within Functional Limits Alignment/Gaze Preference: Within Defined Limits Tracking/Visual Pursuits: Able to track stimulus in all quads without difficulty     Perception     Praxis      Pertinent Vitals/Pain Pain Assessment: Faces Faces Pain Scale: Hurts a little bit Pain Location: mild headache Pain Descriptors / Indicators: Headache Pain Intervention(s): Monitored during session     Hand Dominance Right   Extremity/Trunk Assessment Upper Extremity Assessment Upper Extremity Assessment: Overall WFL for tasks assessed   Lower Extremity Assessment Lower Extremity Assessment: RLE deficits/detail RLE Deficits / Details: Strength 5/5 RLE Sensation: decreased light touch (distally)   Cervical / Trunk Assessment Cervical / Trunk Assessment: Normal   Communication Communication Communication: No difficulties   Cognition Arousal/Alertness: Awake/alert Behavior During Therapy: WFL for tasks assessed/performed Overall Cognitive Status: Within Functional Limits for tasks assessed  General Comments  no physical assist required    Exercises     Shoulder Instructions      Home Living Family/patient expects to be discharged to:: Private residence Living Arrangements: Spouse/significant other;Children Available Help at Discharge: Family Type of Home: House Home Access: Stairs to enter Technical brewer of Steps: 1   Home Layout: One level     Bathroom Shower/Tub: Astronomer Accessibility: Yes   Home Equipment: Civil engineer, contracting - built in          Prior Functioning/Environment Level of Independence: Independent        Comments: works as  Engineer, structural - desk job        OT Problem List: Decreased strength;Decreased activity tolerance      OT Treatment/Interventions:      OT Goals(Current goals can be found in the care plan section) Acute Rehab OT Goals Patient Stated Goal: return to baseline OT Goal Formulation: All assessment and education complete, DC therapy Potential to Achieve Goals: Good  OT Frequency:     Barriers to D/C:            Co-evaluation              AM-PAC OT "6 Clicks" Daily Activity     Outcome Measure Help from another person eating meals?: None Help from another person taking care of personal grooming?: None Help from another person toileting, which includes using toliet, bedpan, or urinal?: None Help from another person bathing (including washing, rinsing, drying)?: None Help from another person to put on and taking off regular upper body clothing?: None Help from another person to put on and taking off regular lower body clothing?: None 6 Click Score: 24   End of Session Equipment Utilized During Treatment: Rolling walker Nurse Communication: Mobility status  Activity Tolerance: Patient tolerated treatment well Patient left: in bed;with call bell/phone within reach  OT Visit Diagnosis: Unsteadiness on feet (R26.81)                Time: [id number] OT Time Calculation (min): 20 min Charges:  OT General Charges $OT Visit: 1 Visit OT Evaluation $OT Eval Low Complexity: 1 Low  Jefferey Pica, OTR/L Acute Rehabilitation Services Pager: [phone] Office: [place] 02/15/2021, 10:06 AM

## 2021-02-15 NOTE — Discharge Summary (Signed)
Physician Discharge Summary  Patient ID: Edwin Moreno MRN: [id number] DOB/AGE: 09/17/1975 45 y.o.  Admit date: 02/13/2021 Discharge date: 02/15/2021  Admission Diagnoses:  Discharge Diagnoses:  Active Problems:   Mass of parietal lobe   Brain tumor [facility])   Discharged Condition: good  Hospital Course: Patient mated to the hospital where he underwent uncomplicated craniotomy and resection of posterior frontal tumor on the left side.  Postoperatively patient is doing reasonably well.  He has a little bit of paresthesias and numbness but is up ambulating.  He has some mild headache but no other complaints.  He feels ready for discharge home.  Brain tumor was consistent with a primary brain tumor intraoperatively but pathology is pending.  Consults:   Significant Diagnostic Studies:   Treatments:   Discharge Exam: Blood pressure 122/78, pulse 66, temperature 98.2 F (36.8 C), temperature source Oral, resp. rate 18, height '6\' 3"'$  (1.905 m), weight 117.9 kg, SpO2 92 %. Awake and alert.  Oriented and appropriate.  Motor examination with minimal right-sided weakness.  Wound clean and dry.  Chest and abdomen benign.  Disposition: Discharge disposition: 01-Home or Self Care        Allergies as of 02/15/2021   No Known Allergies      Medication List     STOP taking these medications    indomethacin 50 MG capsule Commonly known as: INDOCIN       TAKE these medications    allopurinol 300 MG tablet Commonly known as: ZYLOPRIM Take 1 tablet (300 mg total) by mouth daily. (Needs to be seen before next refill)   allopurinol 100 MG tablet Commonly known as: ZYLOPRIM Take 100 mg by mouth daily.   CANNABIDIOL PO Take 1 tablet by mouth daily.   cetirizine 10 MG tablet Commonly known as: ZYRTEC Take 10 mg by mouth daily.   Fish Oil 1200 MG Caps Take 1,200 mg by mouth daily.   Glucosamine HCl 1500 MG Tabs Take 1,500 mg by mouth daily.   HYDROcodone-acetaminophen  5-325 MG tablet Commonly known as: NORCO/VICODIN Take 1 tablet by mouth every 4 (four) hours as needed for moderate pain.   methylPREDNISolone 4 MG Tbpk tablet Commonly known as: MEDROL DOSEPAK follow package directions   omeprazole 40 MG capsule Commonly known as: PRILOSEC TAKE (1) CAPSULE DAILY (Needs to be seen before next refill) What changed: See the new instructions.   OVER THE COUNTER MEDICATION Take 1 tablet by mouth daily. [place]   pravastatin 40 MG tablet Commonly known as: PRAVACHOL TAKE (1/2) TABLET DAILY. (Needs to be seen before next refill) What changed:  how much to take how to take this when to take this additional instructions   vitamin C 1000 MG tablet Take 1,000 mg by mouth daily.   Vitamin D3 50 MCG (2000 UT) Tabs Take 2,000 Units by mouth daily.   zinc gluconate 50 MG tablet Take 50 mg by mouth daily.         Signed: Cooper Render Corneisha Alvi 02/15/2021, 10:11 AM

## 2021-02-15 NOTE — Progress Notes (Signed)
Physical Therapy Treatment and Discharge Patient Details Name: Edwin Moreno MRN: [id number] DOB: 05-08-1976 Today's Date: 02/15/2021    History of Present Illness Pt is a 45 y.o. M who presents with dizziness and aura-like symptoms, workup revealed a left parietal enhancing mass. Now s/p L craniotomy for tumor resection 02/13/2021. No significant PMH.    PT Comments    Pt verbalizing continued sensation impairments in distal RLE, light sensitivity and mild headache. Session focused on gait and stair training in preparation for d/c home. Pt ambulating hallway distances with use of a cane and min cues for right heel strike and upward gaze. Negotiated 4 steps with a cane with good carryover of instructions for technique. Pt reports he has a cane at home; continue to recommend assistive device of cane vs RW for mobility initially. No further acute PT needs at this time. See below for recommendations.     Follow Up Recommendations  Outpatient PT     Equipment Recommendations  Rolling walker with 5" wheels    Recommendations for Other Services       Precautions / Restrictions Precautions Precautions: None Restrictions Weight Bearing Restrictions: No    Mobility  Bed Mobility Overal bed mobility: Modified Independent                  Transfers Overall transfer level: Modified independent Equipment used: Straight cane                Ambulation/Gait Ambulation/Gait assistance: Supervision Gait Distance (Feet): 200 Feet Assistive device: Straight cane Gait Pattern/deviations: Step-through pattern;Decreased dorsiflexion - right;Narrow base of support;Decreased stance time - right Gait velocity: decreased   General Gait Details: Min cues for right heel strike and upward gaze. Pt demonstrating equal step lengths, steady pace   Stairs Stairs: Yes Stairs assistance: Supervision Stair Management: No rails;With cane Number of Stairs: 4 General stair comments: cues  for sequencing and using visual input; pt with excellent carryover and able to do second trial with no cues from PT   Wheelchair Mobility    Modified Rankin (Stroke Patients Only)       Balance Overall balance assessment: Mild deficits observed, not formally tested                                          Cognition Arousal/Alertness: Awake/alert Behavior During Therapy: WFL for tasks assessed/performed Overall Cognitive Status: Within Functional Limits for tasks assessed                                        Exercises      General Comments        Pertinent Vitals/Pain Pain Assessment: Faces Faces Pain Scale: Hurts a little bit Pain Location: mild headache Pain Descriptors / Indicators: Headache Pain Intervention(s): Monitored during session    Home Living                      Prior Function            PT Goals (current goals can now be found in the care plan section) Acute Rehab PT Goals Patient Stated Goal: return to baseline PT Goal Formulation: With patient Time For Goal Achievement: 02/28/21 Potential to Achieve Goals: Good Progress towards PT goals: Goals met/education completed, patient  discharged from PT    Frequency    Min 3X/week      PT Plan Other (comment) (d/c therapies)    Co-evaluation              AM-PAC PT "6 Clicks" Mobility   Outcome Measure  Help needed turning from your back to your side while in a flat bed without using bedrails?: None Help needed moving from lying on your back to sitting on the side of a flat bed without using bedrails?: None Help needed moving to and from a bed to a chair (including a wheelchair)?: None Help needed standing up from a chair using your arms (e.g., wheelchair or bedside chair)?: None Help needed to walk in hospital room?: A Little Help needed climbing 3-5 steps with a railing? : A Little 6 Click Score: 22    End of Session   Activity  Tolerance: Patient tolerated treatment well Patient left: in bed;with call bell/phone within reach Nurse Communication: Mobility status PT Visit Diagnosis: Other abnormalities of gait and mobility (R26.89)     Time: 0910-6816 PT Time Calculation (min) (ACUTE ONLY): 17 min  Charges:  $Gait Training: 8-22 mins                     Wyona Almas, PT, DPT Acute Rehabilitation Services Pager [phone] Office [phone]    Deno Etienne 02/15/2021, 8:12 AM

## 2021-02-15 NOTE — Plan of Care (Signed)
  Problem: Education: Goal: Knowledge of General Education information will improve Description: Including pain rating scale, medication(s)/side effects and non-pharmacologic comfort measures Outcome: Adequate for Discharge   Problem: Health Behavior/Discharge Planning: Goal: Ability to manage health-related needs will improve Outcome: Adequate for Discharge   Problem: Clinical Measurements: Goal: Ability to maintain clinical measurements within normal limits will improve Outcome: Adequate for Discharge Goal: Will remain free from infection Outcome: Adequate for Discharge Goal: Diagnostic test results will improve Outcome: Adequate for Discharge Goal: Respiratory complications will improve Outcome: Adequate for Discharge Goal: Cardiovascular complication will be avoided Outcome: Adequate for Discharge   Problem: Coping: Goal: Level of anxiety will decrease Outcome: Adequate for Discharge   Problem: Elimination: Goal: Will not experience complications related to bowel motility Outcome: Adequate for Discharge Goal: Will not experience complications related to urinary retention Outcome: Adequate for Discharge   Problem: Skin Integrity: Goal: Risk for impaired skin integrity will decrease Outcome: Adequate for Discharge   Problem: Safety: Goal: Ability to remain free from injury will improve Outcome: Adequate for Discharge

## 2021-02-16 ENCOUNTER — Encounter (HOSPITAL_COMMUNITY): Payer: Self-pay | Admitting: Neurological Surgery

## 2021-02-23 ENCOUNTER — Other Ambulatory Visit: Payer: Self-pay | Admitting: Radiation Therapy

## 2021-02-23 ENCOUNTER — Telehealth: Payer: Self-pay | Admitting: Radiation Therapy

## 2021-02-23 ENCOUNTER — Encounter (HOSPITAL_COMMUNITY): Payer: Self-pay | Admitting: Neurological Surgery

## 2021-02-23 ENCOUNTER — Other Ambulatory Visit: Payer: Self-pay

## 2021-02-23 ENCOUNTER — Ambulatory Visit: Payer: BC Managed Care – PPO | Attending: Neurological Surgery

## 2021-02-23 VITALS — BP 134/95 | HR 85

## 2021-02-23 DIAGNOSIS — M542 Cervicalgia: Secondary | ICD-10-CM | POA: Diagnosis present

## 2021-02-23 DIAGNOSIS — R5381 Other malaise: Secondary | ICD-10-CM

## 2021-02-23 DIAGNOSIS — R262 Difficulty in walking, not elsewhere classified: Secondary | ICD-10-CM | POA: Insufficient documentation

## 2021-02-23 NOTE — Telephone Encounter (Signed)
Spoke with pt this morning regarding our conference discussion. Dr. Zada Finders has referred this patient to be seen by Dr. Mickeal Skinner. Dr. Mickeal Skinner has requested a visit early next week to allow more time for the pathology results to come in.  [name] has been added to the 8/29 conference for path review and will see Dr. Mickeal Skinner 8/29 as well.    Edwin Moreno R.T.(R)(T) Radiation Special Procedures Navigator

## 2021-02-23 NOTE — Therapy (Signed)
[facility] [place], [place], [address] Phone: [phone]   Fax:  [phone]  Physical Therapy Evaluation  Patient Details  Name: Edwin Moreno MRN: [id number] Date of Birth: [birth year] Referring Provider (PT): Emelda Brothers MD   Encounter Date: 02/23/2021   PT End of Session - 02/23/21 1608     Visit Number 1    Number of Visits 4    Date for PT Re-Evaluation 03/23/21    PT Start Time [id number]    PT Stop Time 1345    PT Time Calculation (min) 40 min             Past Medical History:  Diagnosis Date   Arthritis    Asthma    CHILDHOOD HX   GERD (gastroesophageal reflux disease)    Gout    H/O removal of cyst    hip as a child   Hyperlipidemia     Past Surgical History:  Procedure Laterality Date   APPLICATION OF CRANIAL NAVIGATION N/A 02/13/2021   Procedure: APPLICATION OF CRANIAL NAVIGATION;  Surgeon: Judith Part, MD;  Location: [facility];  Service: Neurosurgery;  Laterality: N/A;  RM 19   CRANIOTOMY Left 02/13/2021   Procedure: Left Craniotomy for tumor resection with brainlab;  Surgeon: Judith Part, MD;  Location: [facility];  Service: Neurosurgery;  Laterality: Left;    Vitals:   02/23/21 1322  BP: (!) 134/95  Pulse: 85      Subjective Assessment - 02/23/21 1306     Subjective Patient reports to OPPT 10 days  status post craniotomy with brain tumor resection. Patient states that he has headahces that are constant but can get worse with loud sounds or disturbances. He was told that after 2  weeks he may have headaches hit or miss that shoulds subside. He states that he had a  gout flare up yesterday - he began taking his medication again for this (Indomethacin.) He is aware that he was told to stop taking it  but elected to resume taking for treating the gout. On Monday he has an oncology appt. On Friday, he will follow up with neurosurgeon . He feels less pressure in the bottom of his R foot  at the ball of his lesser toes. He is walking more around the house without a cane or a walker and denies recent falls.  He feels that having shoes on helps him with his sensation more. 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    Pertinent History s/p craniotomy    Limitations Reading;Other (comment)    How long can you sit comfortably? not limited    How long can you stand comfortably? "have not gauged that yet    How long can you walk comfortably? no problem    Patient Stated Goals To be able to return to work without any issues (works as Youth worker in [place] )    Currently in Pain? Other (Comment)   Gout in the Larg toe   Pain Score 1     Pain Location Head    Pain Orientation Posterior    Pain Descriptors / Indicators Aching    Pain Type Surgical pain                [facility] PT Assessment - 02/23/21 0001       Assessment   Medical Diagnosis Craniotomy with brain resection    Referring Provider (PT) Emelda Brothers  MD    Onset Date/Surgical Date 02/13/21    Hand Dominance Right    Next MD Visit 02/27/21    Prior Therapy d/c from hospital      Precautions   Precautions Other (comment)   crniotomy     Balance Screen   Has the patient fallen in the past 6 months Yes      [place] residence      Prior Function   Level of Independence Independent;Independent with basic ADLs;Other (comment)   unable to provide self-transportation at this time     Cognition   Overall Cognitive Status Within Functional Limits for tasks assessed   Plan to further assess with [facility]     Observation/Other Assessments   Observations healing incision at the posterolateral cranium, disolvable \ stitches present    Cranial Nerve(s) CN I- XII intact and unremarkable      Sensation   Light Touch Appears Intact;Impaired Detail    Light Touch Impaired Details Impaired RLE    Proprioception Appears Intact      Coordination   Gross  Motor Movements are Fluid and Coordinated Yes    Fine Motor Movements are Fluid and Coordinated Yes    Finger Nose Finger Test Yes    Heel Shin Test Yes      Functional Tests   Functional tests Sit to Stand      Sit to Stand   Comments Performs with minimal difficuty - reduced frequency to avoid significant change in blood pressure      Deep Tendon Reflexes   DTR Assessment Site Biceps;Brachioradialis;Triceps;Patella    Biceps DTR 2+    Brachioradialis DTR 2+    Triceps DTR 2+    Patella DTR 2+      ROM / Strength   AROM / PROM / Strength AROM;Strength      AROM   Overall AROM  Within functional limits for tasks performed      Strength   Overall Strength Within functional limits for tasks performed      Ambulation/Gait   Ambulation/Gait Yes    Gait Comments antalgic due to R foot Gout flare up - limited increased assessment      Balance   Balance Assessed Yes    Balance comment tandem bil 20 sec without wavering ( decreased stance on RLE d/t pain in great toe he identified as Gout                        Objective measurements completed on examination: See above findings.               PT Education - 02/23/21 1540     Education Details HEP : Corporate investment banker, puzzles, AROM cervical slowly as to not stress surgical site or change equilbrium, cnt'd walking program, desensitization with varying textures of fabrics    Person(s) Educated Patient    Methods Explanation;Demonstration    Comprehension Verbalized understanding              PT Short Term Goals - 02/23/21 1605       PT SHORT TERM GOAL #1   Title Patient will be indep with an inital HEP    Baseline limited knoeldge of HEP    Time 1    Period Weeks    Status New    Target Date 03/02/21               PT  Long Term Goals - 02/23/21 1606       PT LONG TERM GOAL #1   Title Patient will be indep with an advanced HEP    Time 4    Period Weeks    Status New     Target Date 03/23/21      PT LONG TERM GOAL #2   Title Patinet will be able to demonstrate normal reciprocal gait pattern for 6MWT within normative values.    Baseline not  assessed due to gout flare up in great toe    Time 4    Period Weeks    Status New    Target Date 03/23/21      PT LONG TERM GOAL #3   Title patient will be able to demonstrate excellent problem solving and functional planning through serier of [place] without limitations    Time 4    Period Weeks    Status New    Target Date 03/23/21                    Plan - 02/23/21 1558     Clinical Impression Statement Edwin Moreno is a pleasant 45 yo male with history of craniomtom brain tumor resection - he presents to OPPT with previous concerns for gait and balane. He has some concentration deicits and reports headahces with prolonged visual or auditory stimulis. He presents well nourished, able, and independent. Site of incision anticipated to limit cervical ROM later in plan of care but not limiting now due to mobility of the fascia at the area. Skilled PT recommends following up with MD regarding Gout flare ups and medicaiton menagemnen, with future therapy to enocurage concentration, planning, and dual taks activities.    Personal Factors and Comorbidities Social Background;Past/Current Experience    Examination-Activity Limitations Lift    Examination-Participation Restrictions Occupation;Driving    Stability/Clinical Decision Making Evolving/Moderate complexity    Clinical Decision Making Moderate    Rehab Potential Good    PT Frequency 1x / week    PT Duration 4 weeks    PT Treatment/Interventions Therapeutic activities;Neuromuscular re-education;Cognitive remediation;Balance training;Patient/family education;Vestibular;Therapeutic exercise    PT Next Visit Plan Follow up regarding two MD appts recommendations; cardiovascular endurance    Consulted and Agree with Plan of Care Patient              Patient will benefit from skilled therapeutic intervention in order to improve the following deficits and impairments:  Decreased activity tolerance, Other (comment), Impaired sensation, Decreased balance, Abnormal gait, Decreased endurance, Dizziness, Decreased scar mobility  Visit Diagnosis: Difficulty in walking, not elsewhere classified  Cervicalgia  Physical deconditioning     Problem List Patient Active Problem List   Diagnosis Date Noted   Mass of parietal lobe 02/13/2021   Brain tumor ([place]) 02/13/2021   Hypertriglyceridemia 09/18/2015   Gout 09/18/2015   Nodule, subcutaneous 05/02/2015    Marylou Mccoy PT, DPT 02/23/2021, 4:09 PM  [facility] [place], [place], [address] Phone: [phone]   Fax:  [phone]  Name: Edwin Moreno MRN: [id number] Date of Birth: 01-24-1976

## 2021-03-02 ENCOUNTER — Inpatient Hospital Stay: Payer: BC Managed Care – PPO | Attending: Internal Medicine

## 2021-03-02 ENCOUNTER — Other Ambulatory Visit: Payer: Self-pay

## 2021-03-02 ENCOUNTER — Other Ambulatory Visit: Payer: Self-pay | Admitting: Radiation Therapy

## 2021-03-02 ENCOUNTER — Inpatient Hospital Stay: Payer: BC Managed Care – PPO | Admitting: Internal Medicine

## 2021-03-02 VITALS — BP 138/92 | HR 63 | Temp 98.0°F | Resp 17 | Ht 75.0 in | Wt 253.5 lb

## 2021-03-02 DIAGNOSIS — Z7952 Long term (current) use of systemic steroids: Secondary | ICD-10-CM | POA: Diagnosis not present

## 2021-03-02 DIAGNOSIS — R609 Edema, unspecified: Secondary | ICD-10-CM

## 2021-03-02 DIAGNOSIS — C719 Malignant neoplasm of brain, unspecified: Secondary | ICD-10-CM

## 2021-03-02 DIAGNOSIS — Z79899 Other long term (current) drug therapy: Secondary | ICD-10-CM | POA: Diagnosis not present

## 2021-03-02 DIAGNOSIS — I6782 Cerebral ischemia: Secondary | ICD-10-CM | POA: Insufficient documentation

## 2021-03-02 DIAGNOSIS — R42 Dizziness and giddiness: Secondary | ICD-10-CM

## 2021-03-02 DIAGNOSIS — G9389 Other specified disorders of brain: Secondary | ICD-10-CM | POA: Diagnosis not present

## 2021-03-02 MED ORDER — LEVETIRACETAM 250 MG PO TABS: 250.0000 mg | ORAL_TABLET | Freq: Two times a day (BID) | ORAL | 3 refills | Status: DC

## 2021-03-02 NOTE — Progress Notes (Signed)
[facility] [place], [place] [address] [phone]   New Patient Evaluation  Date of Service: 03/02/21 Patient Name: Edwin Moreno Patient MRN: [id number] Patient DOB: 08/05/1975 Provider: [name], MD  Identifying Statement:  Edwin Moreno is a 45 y.o. 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    Referring Provider: Judith Part, MD [place],  [place] [id number]  Oncologic History: Oncology History  Anaplastic oligodendroglioma, IDH mutant and 1p/19q-codeleted ([facility])  02/13/2021 Initial Diagnosis   Anaplastic oligodendroglioma, IDH mutant and 1p/19q-codeleted ([facility])   02/13/2021 Surgery   Craniotomy, resection of left frontal mass by Dr. Zada Finders; path is WHO 3 oligodendroglioma, IDH-mt     Biomarkers:  MGMT Unknown.  IDH 1/2 Mutated.  1p/19q pending  TERT Unknown   History of Present Illness: The patient's records from the referring physician were obtained and reviewed and the patient interviewed to confirm this HPI.  Edwin Moreno presented to medical attention this July with complaint of several years history of dizziness.  He described episodes of dizziness which would last for seconds to a minute, would come on "at any time".  They did/do not lead to any impairment or dyfunction.  Overall frequency had increased over the past months, to greater than once per week.  CNS imaging demonstrated an enhancing mass within the left frontal lobe, consistent with primary brain tumor.  He underwent craniotomy and resection with Dr. Zada Finders on 02/13/21; path demonstrates high grade glioma with IDH mutation.  Following surgery, he has not complained of dizzy episodes, or any neurologic issues.  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  Medications: Current Outpatient Medications on File Prior to Visit   Medication Sig Dispense Refill   allopurinol (ZYLOPRIM) 100 MG tablet Take 100 mg by mouth daily.     allopurinol (ZYLOPRIM) 300 MG tablet Take 1 tablet (300 mg total) by mouth daily. (Needs to be seen before next refill) 30 tablet 0   Ascorbic Acid (VITAMIN C) 1000 MG tablet Take 1,000 mg by mouth daily.     CANNABIDIOL PO Take 1 tablet by mouth daily.     cetirizine (ZYRTEC) 10 MG tablet Take 10 mg by mouth daily.     Cholecalciferol (VITAMIN D3) 50 MCG (2000 UT) TABS Take 2,000 Units by mouth daily.     Glucosamine HCl 1500 MG TABS Take 1,500 mg by mouth daily.     HYDROcodone-acetaminophen (NORCO/VICODIN) 5-325 MG tablet Take 1 tablet by mouth every 4 (four) hours as needed for moderate pain. 30 tablet 0   indomethacin (INDOCIN SR) 75 MG CR capsule Take 75 mg by mouth 2 (two) times daily with a meal.     methylPREDNISolone (MEDROL DOSEPAK) 4 MG TBPK tablet follow package directions 21 tablet 0   methylPREDNISolone (MEDROL DOSEPAK) 4 MG TBPK tablet Follow package directions. 1 each 0   Omega-3 Fatty Acids (FISH OIL) 1200 MG CAPS Take 1,200 mg by mouth daily.     omeprazole (PRILOSEC) 40 MG capsule TAKE (1) CAPSULE DAILY (Needs to be seen before next refill) (Patient taking differently: Take 40 mg by mouth daily.) 30 capsule 0   OVER THE COUNTER MEDICATION Take 1 tablet by mouth daily. [place]     pravastatin (PRAVACHOL) 40 MG tablet TAKE (1/2) TABLET DAILY. (Needs to be seen before next  refill) (Patient taking differently: Take 20 mg by mouth daily.) 90 tablet 1   zinc gluconate 50 MG tablet Take 50 mg by mouth daily.     No current facility-administered medications on file prior to visit.    Allergies: No Known Allergies Past Medical History:  Past Medical History:  Diagnosis Date   Arthritis    Asthma    CHILDHOOD HX   GERD (gastroesophageal reflux disease)    Gout    H/O removal of cyst    hip as a child   Hyperlipidemia    Past Surgical History:  Past Surgical  History:  Procedure Laterality Date   APPLICATION OF CRANIAL NAVIGATION N/A 02/13/2021   Procedure: APPLICATION OF CRANIAL NAVIGATION;  Surgeon: Judith Part, MD;  Location: [facility];  Service: Neurosurgery;  Laterality: N/A;  RM 19   CRANIOTOMY Left 02/13/2021   Procedure: Left Craniotomy for tumor resection with brainlab;  Surgeon: Judith Part, MD;  Location: [facility];  Service: Neurosurgery;  Laterality: Left;   Social History:  Social History   Socioeconomic History   Marital status: Married    Spouse name: Not on file   Number of children: Not on file   Years of education: Not on file   Highest education level: Not on file  Occupational History   Not on file  Tobacco Use   Smoking status: Never   Smokeless tobacco: Former    Types: Nurse, children's Use: Never used  Substance and Sexual Activity   Alcohol use: Yes    Comment: 3-4 x weekly- beers   Drug use: No   Sexual activity: Not on file  Other Topics Concern   Not on file  Social History Narrative   Not on file   Social Determinants of Health   Financial Resource Strain: Not on file  Food Insecurity: Not on file  Transportation Needs: Not on file  Physical Activity: Not on file  Stress: Not on file  Social Connections: Not on file  Intimate Partner Violence: Not on file   Family History: No family history on file.  Review of Systems: Constitutional: Doesn't report fevers, chills or abnormal weight loss Eyes: Doesn't report blurriness of vision Ears, nose, mouth, throat, and face: Doesn't report sore throat Respiratory: Doesn't report cough, dyspnea or wheezes Cardiovascular: Doesn't report palpitation, chest discomfort  Gastrointestinal:  Doesn't report nausea, constipation, diarrhea GU: Doesn't report incontinence Skin: Doesn't report skin rashes Neurological: Per HPI Musculoskeletal: Doesn't report joint pain Behavioral/Psych: Doesn't report anxiety  Physical Exam: Vitals:    03/02/21 0949  BP: (!) 138/92  Pulse: 63  Resp: 17  Temp: 98 F (36.7 C)  SpO2: 100%   KPS: 90. General: Alert, cooperative, pleasant, in no acute distress Head: Normal EENT: No conjunctival injection or scleral icterus.  Lungs: Resp effort normal Cardiac: Regular rate Abdomen: Non-distended abdomen Skin: No rashes cyanosis or petechiae. Extremities: No clubbing or edema  Neurologic Exam: Mental Status: Awake, alert, attentive to examiner. Oriented to self and environment. Language is fluent with intact comprehension.  Cranial Nerves: Visual acuity is grossly normal. Visual fields are full. Extra-ocular movements intact. No ptosis. Face is symmetric Motor: Tone and bulk are normal. Power is full in both arms and legs. Reflexes are symmetric, no pathologic reflexes present.  Sensory: Intact to light touch Gait: Normal.   Labs: I have reviewed the data as listed    Component Value Date/Time   NA 141 02/13/2021 1033  NA 141 12/19/2018 1048   K 5.1 02/13/2021 1033   CL 103 02/09/2021 0900   CO2 28 02/09/2021 0900   GLUCOSE 92 02/09/2021 0900   BUN 11 02/09/2021 0900   BUN 15 12/19/2018 1048   CREATININE 1.12 02/13/2021 1255   CALCIUM 9.4 02/09/2021 0900   PROT 6.9 12/19/2018 1048   ALBUMIN 5.0 12/19/2018 1048   AST 33 12/19/2018 1048   ALT 34 12/19/2018 1048   ALKPHOS 71 12/19/2018 1048   BILITOT 1.1 12/19/2018 1048   GFRNONAA >60 02/13/2021 1255   GFRAA 102 12/19/2018 1048   Lab Results  Component Value Date   WBC 6.4 02/13/2021   NEUTROABS 3.6 12/19/2018   HGB 14.5 02/13/2021   HCT 42.0 02/13/2021   MCV 96.8 02/13/2021   PLT 207 02/13/2021    Imaging:  MR BRAIN W WO CONTRAST  Result Date: 02/14/2021 CLINICAL DATA:  Follow-up examination status post craniotomy for tumor resection. EXAM: MRI HEAD WITHOUT AND WITH CONTRAST TECHNIQUE: Multiplanar, multiecho pulse sequences of the brain and surrounding structures were obtained without and with intravenous  contrast. CONTRAST:  32m GADAVIST GADOBUTROL 1 MMOL/ML IV SOLN COMPARISON:  MRI from 01/24/2021. FINDINGS: Brain: Postoperative changes from interval left parietal craniotomy for tumor resection are seen. Mild scattered small volume postoperative pneumocephalus overlies the anterior left cerebral convexity. Intrinsic T1 hyperintensity with susceptibility artifact about the resection bed consistent with postoperative blood products. Previously seen tumor has been resected, with only minimal enhancement seen about the deep margin of the resection cavity. No visible residual tumor seen at this time. Persistent vasogenic edema within the adjacent parasagittal left parietal lobe, slightly decreased from previous now measuring 5.5 x 2.1 cm (previously 5.9 x 2.6 cm). A small 1 cm ischemic infarct seen within the subcortical posterior left frontal lobe adjacent to the resection cavity (series 2, image 38). No other complication or adverse features. Remainder of the brain remains otherwise normal in appearance. No other mass lesion or abnormal enhancement. No hydrocephalus or significant extra-axial fluid collection. Vascular: Major intracranial vascular flow voids are maintained, with specific note made of a preserved flow void within the posterior superior sagittal sinus. Skull and upper cervical spine: Post craniotomy changes at the left parietal calvarium with overlying scalp swelling and edema. Sinuses/Orbits: Unremarkable. Other: None. IMPRESSION: 1. Postoperative changes from interval left parietal craniotomy for tumor resection. No visible residual enhancing tumor with decreased localized FLAIR signal abnormality status post resection. 2. Small 1 cm acute ischemic infarct within the adjacent subcortical posterior left frontal region. 3. Otherwise stable and negative brain MRI. Electronically Signed   By: BJeannine BogaM.D.   On: 02/14/2021 02:01     Pathology: SURGICAL PATHOLOGY  CASE: [id number]   PATIENT: Edwin Moreno  Surgical Pathology Report   Clinical History: brain tumor (cm)   FINAL MICROSCOPIC DIAGNOSIS:   A. BRAIN TUMOR, LEFT PARIETAL, RESECTION:  - High-grade infiltrating glioma, IDH-mutant, see comment   COMMENT:   This case was sent in consultation [facility], [place].  Additional molecular analysis,  including assessment of 1p and 19q is pending and will be reported in an  addendum.  A complete copy of the outside pathology report is available  in patient's electronic medical records.   INTRAOPERATIVE DIAGNOSIS:   A.  Left parietal brain tumor resection: "Lesional tissue present."  Intraoperative diagnosis rendered by Dr. PSaralyn Pilarat 1045 on 02/13/2021   GROSS DESCRIPTION:   A.  Received  in saline for intraoperative consultation labeled with the  patient's name and date of birth is a 2.3 x 2.0 x 0.5 cm aggregate of  red-tan, mushy soft tissue. A portion is submitted on one chuck for  frozen section diagnosis and subsequently in cassette A1 for routine  histologic processing. The remaining tissue is submitted in toto in  cassette A2.   (LEF, 02/13/2021)   Final Diagnosis performed by Jaquita Folds, MD.   Electronically  signed 02/23/2021    Assessment/Plan Anaplastic oligodendroglioma, IDH mutant and 1p/19q-codeleted [facility])  We appreciate the opportunity to participate in the care of Edwin Moreno.  He is doing well clinically following surgery earlier this month.  We had an extensive conversation with him regarding pathology, prognosis, and available treatment pathways for high grade glioma.  Path is still pending formal 1p/19q status, but histology is of oligodendroglial phenotype.  Of greater concern is higher grade histology with elevated mitotic count and reported high Ki-67 index.  In addition, there is some remaining non-enhancing signal abnormality on post-operative MRI which may be consistent with  residual tumor.     We ultimately recommended proceeding with course of intensity modulated radiation therapy and concurrent daily Temozolomide.  Radiation will be administered Mon-Fri over 6 weeks, Temodar will be dosed at 88m/m2 to be given daily over 42 days.  We reviewed side effects of temodar, including fatigue, nausea/vomiting, constipation, and cytopenias.  Informed consent was verbally obtained at bedside to proceed with oral chemotherapy.  Chemotherapy should be held for the following:  ANC less than 1,000  Platelets less than 100,000  LFT or creatinine greater than 2x ULN  If clinical concerns/contraindications develop  Every 2 weeks during radiation, labs will be checked accompanied by a clinical evaluation in the brain tumor clinic.  Keppra should resume at 2522mBID, given suspicion for seizure activity (yet uncomfirmed).  No need for corticosteroids currently.  Will arrange consultation with radiation oncology.   Screening for potential clinical trials was performed and discussed using eligibility criteria for active protocols at [facility], as well as national database available on [email or web address]   The patient is not a candidate for a research protocol at this time due to no suitable study identified.   We spent twenty additional minutes teaching regarding the natural history, biology, and historical experience in the treatment of brain tumors. We then discussed in detail the current recommendations for therapy focusing on the mode of administration, mechanism of action, anticipated toxicities, and quality of life issues associated with this plan. We also provided teaching sheets for the patient to take home as an additional resource.  All questions were answered. The patient knows to call the clinic with any problems, questions or concerns. No barriers to learning were detected.  The total time spent in the encounter was 60  minutes and more than 50% was on counseling and review of test results   ZaVentura SellersMD Medical Director of Neuro-Oncology [facility]8/29/22 9:46 AM

## 2021-03-03 ENCOUNTER — Other Ambulatory Visit: Payer: Self-pay

## 2021-03-03 ENCOUNTER — Ambulatory Visit: Payer: BC Managed Care – PPO

## 2021-03-03 DIAGNOSIS — R262 Difficulty in walking, not elsewhere classified: Secondary | ICD-10-CM

## 2021-03-03 DIAGNOSIS — R5381 Other malaise: Secondary | ICD-10-CM

## 2021-03-03 DIAGNOSIS — M542 Cervicalgia: Secondary | ICD-10-CM

## 2021-03-03 NOTE — Therapy (Signed)
[facility] [address] [place], [place], [address] Phone: [phone]   Fax:  [phone]  Physical Therapy Treatment  Patient Details  Name: Edwin Moreno MRN: [id number] Date of Birth: 08/03/1975 Referring Provider (PT): Thomas Ostergard MD   Encounter Date: 03/03/2021    Past Medical History:  Diagnosis Date   Arthritis    Asthma    CHILDHOOD HX   GERD (gastroesophageal reflux disease)    Gout    H/O removal of cyst    hip as a child   Hyperlipidemia     Past Surgical History:  Procedure Laterality Date   APPLICATION OF CRANIAL NAVIGATION N/A 02/13/2021   Procedure: APPLICATION OF CRANIAL NAVIGATION;  Surgeon: Ostergard, Thomas A, MD;  Location: [facility] OR;  Service: Neurosurgery;  Laterality: N/A;  RM 19   CRANIOTOMY Left 02/13/2021   Procedure: Left Craniotomy for tumor resection with brainlab;  Surgeon: Ostergard, Thomas A, MD;  Location: [facility] OR;  Service: Neurosurgery;  Laterality: Left;    There were no vitals filed for this visit.   Subjective Assessment - 03/03/21 0956     Subjective COVID-19 screening performed upon arrival. He anticipates starting chemo in the next 3 weeks.He reports that he has been trying to get around and feels he does well however he has some stiffness in the neck and can tell that he turns his whole body instead of just his neck when looking over his shoulder.    Pertinent History s/p craniotomy, hx of CA - actie markers indicated    Limitations Reading;Other (comment)    How long can you sit comfortably? not limited    How long can you stand comfortably? "have not gauged that yet    How long can you walk comfortably? no problem    Patient Stated Goals To be able to return to work without any issues (works as Chief of police in [place] )                               [facility] Adult PT Treatment/Exercise - 03/03/21 0001       Ambulation/Gait   Ambulation/Gait Yes     Assistive device None    Gait Comments unremarkable      Standardized Balance Assessment   Standardized Balance Assessment Dynamic Gait Index      Dynamic Gait Index   Level Surface Normal    Change in Gait Speed Normal    Gait with Horizontal Head Turns Normal    Gait with Vertical Head Turns Normal    Gait and Pivot Turn Normal    Step Over Obstacle Normal    Step Around Obstacles Normal    Steps Normal    Total Score 24      High Level Balance   High Level Balance Activities Tandem walking    High Level Balance Comments without deficits      Neuro Re-ed    Neuro Re-ed Details  JPE chart with head lamp laser 5 x 4way to center eyes closed- success 6/10 lateral ; 4/10 vertical      Exercises   Exercises Neck      Neck Exercises: Machines for Strengthening   Nustep lvl 3 - 8 mins; core cues      Neck Exercises: Stabilization   Stabilization see neuro-re-ed      Neck Exercises: Stretches   Other Neck Stretches seated SNAGS (ext and rotation)        Manual Therapy   Manual Therapy Soft tissue mobilization;Joint mobilization    Manual therapy comments modified prone - head in cradled position on elevated plinth, sat in chair    Joint Mobilization CPA and UPA bil C5-C7, scapular inferior mob,    Soft tissue mobilization STM to LS and UT, cervical paraspinals                      PT Short Term Goals - 02/23/21 1605       PT SHORT TERM GOAL #1   Title Patient will be indep with an inital HEP    Baseline limited knoeldge of HEP    Time 1    Period Weeks    Status New    Target Date 03/02/21               PT Long Term Goals - 02/23/21 1606       PT LONG TERM GOAL #1   Title Patient will be indep with an advanced HEP    Time 4    Period Weeks    Status New    Target Date 03/23/21      PT LONG TERM GOAL #2   Title Patinet will be able to demonstrate normal reciprocal gait pattern for 6MWT within normative values.    Baseline not  assessed due  to gout flare up in great toe    Time 4    Period Weeks    Status New    Target Date 03/23/21      PT LONG TERM GOAL #3   Title patient will be able to demonstrate excellent problem solving and functional planning through serier of [place] without limitations    Time 4    Period Weeks    Status New    Target Date 03/23/21                   Plan - 03/03/21 1524     Clinical Impression Statement Today therapy session focused on cervical mobility and proprioception. He performs all gait and balance activities without limitation. HEP was provided to enocurage improving cervical mpobiltiy at home with low grade assited ranges. He demonstrates comprehension and retention with each activity. JPE activity was beneficial as he demonstrates steady improvement with correcting for target with each attempt. 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 He demonstrated improved mobiltiy of cervical rotation following cervical manual therapy and SNAGs. Skilled PT recommended to continue with goals of decreasing cervialgia. Updating plan of care to be expected as patient will begin chemotherapy treatments in the near future.    Personal Factors and Comorbidities Social Background;Past/Current Experience    Examination-Activity Limitations Lift    Examination-Participation Restrictions Occupation;Driving    Stability/Clinical Decision Making Evolving/Moderate complexity    Clinical Decision Making Moderate    Rehab Potential Good    PT Frequency 1x / week    PT Duration 4 weeks    PT Treatment/Interventions Therapeutic activities;Neuromuscular re-education;Cognitive remediation;Balance training;Patient/family education;Vestibular;Therapeutic exercise    PT Next Visit Plan Follow up regarding two MD appts recommendations; cardiovascular endurance    Consulted and Agree with Plan of Care Patient              Patient will benefit from skilled therapeutic intervention in order to improve the following deficits and impairments:  Decreased activity tolerance, Other (comment), Impaired sensation, Decreased  balance, Abnormal gait, Decreased endurance, Dizziness, Decreased scar mobility  Visit Diagnosis: Difficulty in walking, not elsewhere classified  Cervicalgia  Physical deconditioning     Problem List Patient Active Problem List   Diagnosis Date Noted   Mass of parietal lobe 02/13/2021   Anaplastic oligodendroglioma, IDH mutant and 1p/19q-codeleted (HCC) 02/13/2021   Hypertriglyceridemia 09/18/2015   Gout 09/18/2015   Nodule, subcutaneous 05/02/2015    Courtney N Hunt PT, DPT 03/03/2021, 3:31 PM  [facility] [address] [place], [place], [address] Phone: [phone]   Fax:  [phone]  Name: Josip W Haning MRN: [id number] Date of Birth: 10/01/1975    

## 2021-03-05 NOTE — Progress Notes (Signed)
Location/Histology of Brain Tumor:  Anaplastic oligodendroglioma, IDH mutant and 1p/19q-codeleted  Patient presented with symptoms of:  presented to medical attention this July with complaint of several years history of dizziness.  He described episodes of dizziness which would last for seconds to a minute, would come on "at any time".  They did/do not lead to any impairment or dyfunction.  Overall frequency had increased over the past months, to greater than once per week  Past or anticipated interventions, if any, per neurosurgery:  02/13/2021 Dr. Emelda Brothers --Left craniotomy for tumor resection, use of frameless stereotaxy  MRI Brain w/ & w/o Contrast 02/13/2021 --IMPRESSION: 1. Postoperative changes from interval left parietal craniotomy for tumor resection. No visible residual enhancing tumor with decreased localized FLAIR signal abnormality status post resection. 2. Small 1 cm acute ischemic infarct within the adjacent subcortical posterior left frontal region. 3. Otherwise stable and negative brain MRI  Past or anticipated interventions, if any, per medical oncology:  Under care of Dr. Cecil Cobbs 03/02/2021 --Path is still pending formal 1p/19q status, but histology is of oligodendroglial phenotype --We ultimately recommended proceeding with course of intensity modulated radiation therapy and concurrent daily Temozolomide Radiation will be administered Mon-Fri over 6 weeks, Temodar will be dosed at 4m/m2 to be given daily over 42 days. --Every 2 weeks during radiation, labs will be checked accompanied by a clinical evaluation in the brain tumor clinic. --Keppra should resume at 2542mBID, given suspicion for seizure activity (yet uncomfirmed).  No need for corticosteroids currently --The patient is not a candidate for a research protocol at this time due to no suitable study identified  Dose of Decadron, if applicable: Not currently prescribed  Recent neurologic  symptoms, if any:  Seizures: Patient denies Headaches: Patient denies since surgery (prior to surgery reports experiencing persistent pressure that he wasn't aware of until he stopped taking ibuprofen around the clock) Nausea: Patient denies Dizziness/ataxia: Reports symptoms have resolved since surgery Difficulty with hand coordination: Patient denies Focal numbness/weakness: Reports mild difference/decrease in senstivity to the right leg and foot, but otherwise denies any concerns Visual deficits/changes: Patient denies Confusion/Memory deficits: Patient denies  SAFETY ISSUES: Prior radiation? No Pacemaker/ICD? No Possible current pregnancy? N/A Is the patient on methotrexate? No  Additional Complaints / other details: Patient has received the first 3 COVID vaccines

## 2021-03-05 NOTE — Progress Notes (Signed)
Radiation Oncology         [phone] ________________________________  Initial Outpatient Consultation  Name: Edwin Moreno MRN: [id number]  Date: 03/06/2021  DOB: 08-Nov-1975  HM:CNOBSJ, Cletus Gash, MD  [name], MD   REFERRING PHYSICIAN: [name], MD  DIAGNOSIS:    ICD-10-CM   1. Anaplastic oligodendroglioma, IDH mutant and 1p/19q-codeleted ([place])  C71.9     2. 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  CHIEF COMPLAINT: Here to discuss management of brain cancer  HISTORY OF PRESENT ILLNESS::Edwin Moreno is a 45 y.o. male who presented to medical attention this past July with the complaint of a several year long history of dizziness defined by episodes of dizziness that would come on randomly. Patient reported that the overall frequency had increased in recent months. MRI of the brain performed on 01/24/21 demonstrated a 6 cm mildly enhancing left frontoparietal mass most consistent with a primary CNS neoplasm.   Accordingly, the patient was referred to Dr. Zada Finders on 02/13/21 for craniotomy and resectioning. Pathology from the procedure (performed on 02/13/21) revealed a high-grade infiltrating glioma, IDH-mutant type (probable WHO grade 3).  Preliminary 1p 19q status- codeleted; undergoing analysis at [facility]  Postoperative MRI on August 12 revealed no visible residual enhancing tumor , with decreased FLAIR abnormality, and a small adjacent infarct  Subsequently, the patient was referred to Dr. Mickeal Skinner on 03/02/21. During this time, the patient stated that he has had no additional episodes of dizziness or neurologic issues following his craniotomy. Dr. Mickeal Skinner recommended for the patient to proceeded with a course of intensity modulated radiation therapy and concurrent daily Temozolomide.  Patient was given a prescription for  Keppra which he has not quite started yet.   Dose of Decadron, if applicable: Not currently prescribed  Recent neurologic symptoms, if any:  Seizures: Patient denies Headaches: Patient denies since surgery (prior to surgery reports experiencing persistent pressure that he wasn't aware of until he stopped taking ibuprofen around the clock) Nausea: Patient denies Dizziness/ataxia: Reports symptoms have resolved since surgery Difficulty with hand coordination: Patient denies Focal numbness/weakness: Reports mild difference/decrease in senstivity to the right leg and foot, but otherwise denies any concerns Visual deficits/changes: Patient denies Confusion/Memory deficits: Patient denies  SAFETY ISSUES: Prior radiation? No Pacemaker/ICD? No Possible current pregnancy? N/A Is the patient on methotrexate? 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  He is accompanied by his father today who appears very supportive.  Patient plans to return to work part-time next week  PREVIOUS RADIATION THERAPY: No  PAST MEDICAL HISTORY:  has a past medical history of Arthritis, Asthma, GERD (gastroesophageal reflux disease), Gout, H/O removal of cyst, and Hyperlipidemia.    PAST SURGICAL HISTORY: Past Surgical History:  Procedure Laterality Date   APPLICATION OF CRANIAL NAVIGATION N/A 02/13/2021   Procedure: APPLICATION OF CRANIAL NAVIGATION;  Surgeon: Judith Part, MD;  Location: [facility];  Service: Neurosurgery;  Laterality: N/A;  RM 19   CRANIOTOMY Left 02/13/2021   Procedure: Left Craniotomy for tumor resection with brainlab;  Surgeon: Judith Part, MD;  Location: [facility];  Service: Neurosurgery;  Laterality: Left;    FAMILY HISTORY: family history is not on file.  SOCIAL HISTORY:  reports that he has never  smoked. He has quit using smokeless tobacco.  His smokeless tobacco use included chew. He  reports current alcohol use. He reports that he does not use drugs.  ALLERGIES: Patient has no known allergies.  MEDICATIONS:  Current Outpatient Medications  Medication Sig Dispense Refill   allopurinol (ZYLOPRIM) 100 MG tablet Take 100 mg by mouth daily.     allopurinol (ZYLOPRIM) 300 MG tablet Take 1 tablet (300 mg total) by mouth daily. (Needs to be seen before next refill) 30 tablet 0   Ascorbic Acid (VITAMIN C) 1000 MG tablet Take 1,000 mg by mouth daily.     cetirizine (ZYRTEC) 10 MG tablet Take 10 mg by mouth daily.     Cholecalciferol (VITAMIN D3) 50 MCG (2000 UT) TABS Take 2,000 Units by mouth daily.     Glucosamine HCl 1500 MG TABS Take 1,500 mg by mouth daily.     indomethacin (INDOCIN SR) 75 MG CR capsule Take 75 mg by mouth 2 (two) times daily with a meal.     levETIRAcetam (KEPPRA) 250 MG tablet Take 1 tablet (250 mg total) by mouth 2 (two) times daily. 60 tablet 3   Omega-3 Fatty Acids (FISH OIL) 1200 MG CAPS Take 1,200 mg by mouth daily.     omeprazole (PRILOSEC) 40 MG capsule TAKE (1) CAPSULE DAILY (Needs to be seen before next refill) (Patient taking differently: Take 40 mg by mouth daily.) 30 capsule 0   OVER THE COUNTER MEDICATION Take 1 tablet by mouth daily. [place]     pravastatin (PRAVACHOL) 40 MG tablet TAKE (1/2) TABLET DAILY. (Needs to be seen before next refill) (Patient taking differently: Take 20 mg by mouth daily.) 90 tablet 1   zinc gluconate 50 MG tablet Take 50 mg by mouth daily.     No current facility-administered medications for this encounter.    REVIEW OF SYSTEMS:  Notable for that above.   PHYSICAL EXAM:  height is 6' 3"  (1.905 m) and weight is 253 lb 9.6 oz (115 kg). His temperature is 97.9 F (36.6 C). His blood pressure is 130/93 (abnormal) and his pulse is 53 (abnormal). His respiration is 20 and oxygen saturation is 100%.   General: Alert and oriented, in no acute distress  HEENT: Head is normocephalic. Extraocular movements are  intact. Oropharynx is clear. Heart: Regular in rate and rhythm with no murmurs, rubs, or gallops. Chest: Clear to auscultation bilaterally, with no rhonchi, wheezes, or rales. Abdomen: Soft, nontender, nondistended, with no rigidity or guarding. Extremities: No cyanosis or edema. Lymphatics: see Neck Exam Skin: No concerning lesions. Musculoskeletal: Grossly symmetric strength and muscle tone throughout. Neurologic: Cranial nerves II through XII are grossly intact. No obvious focalities. Speech is fluent. Coordination is intact.  Mild reduced sensation in right lower leg and right foot Psychiatric: Judgment and insight are intact. Affect is appropriate.  KPS = 90  100 - Normal; no complaints; no evidence of disease. 90   - Able to carry on normal activity; minor signs or symptoms of disease. 80   - Normal activity with effort; some signs or symptoms of disease. 2   - Cares for self; unable to carry on normal activity or to do active work. 60   - Requires occasional assistance, but is able to care for most of his personal needs. 50   - Requires considerable assistance and frequent medical care. 55   - Disabled; requires special care and assistance. 30   - Severely disabled; hospital admission is  indicated although death not imminent. 28   - Very sick; hospital admission necessary; active supportive treatment necessary. 10   - Moribund; fatal processes progressing rapidly. 0     - Dead  Karnofsky DA, [place], [place] and [place] [phone]) The use of the nitrogen mustards in the palliative treatment of carcinoma: with particular reference to bronchogenic carcinoma Cancer 1 634-56    LABORATORY DATA:  Lab Results  Component Value Date   WBC 6.4 02/13/2021   HGB 14.5 02/13/2021   HCT 42.0 02/13/2021   MCV 96.8 02/13/2021   PLT 207 02/13/2021   CMP     Component Value Date/Time   NA 141 02/13/2021 1033   NA 141 12/19/2018 1048   K 5.1 02/13/2021 1033   CL 103 02/09/2021  0900   CO2 28 02/09/2021 0900   GLUCOSE 92 02/09/2021 0900   BUN 11 02/09/2021 0900   BUN 15 12/19/2018 1048   CREATININE 1.12 02/13/2021 1255   CALCIUM 9.4 02/09/2021 0900   PROT 6.9 12/19/2018 1048   ALBUMIN 5.0 12/19/2018 1048   AST 33 12/19/2018 1048   ALT 34 12/19/2018 1048   ALKPHOS 71 12/19/2018 1048   BILITOT 1.1 12/19/2018 1048   GFRNONAA >60 02/13/2021 1255   GFRAA 102 12/19/2018 1048         RADIOGRAPHY: MR BRAIN W WO CONTRAST  Result Date: 02/14/2021 CLINICAL DATA:  Follow-up examination status post craniotomy for tumor resection. EXAM: MRI HEAD WITHOUT AND WITH CONTRAST TECHNIQUE: Multiplanar, multiecho pulse sequences of the brain and surrounding structures were obtained without and with intravenous contrast. CONTRAST:  45m GADAVIST GADOBUTROL 1 MMOL/ML IV SOLN COMPARISON:  MRI from 01/24/2021. FINDINGS: Brain: Postoperative changes from interval left parietal craniotomy for tumor resection are seen. Mild scattered small volume postoperative pneumocephalus overlies the anterior left cerebral convexity. Intrinsic T1 hyperintensity with susceptibility artifact about the resection bed consistent with postoperative blood products. Previously seen tumor has been resected, with only minimal enhancement seen about the deep margin of the resection cavity. No visible residual tumor seen at this time. Persistent vasogenic edema within the adjacent parasagittal left parietal lobe, slightly decreased from previous now measuring 5.5 x 2.1 cm (previously 5.9 x 2.6 cm). A small 1 cm ischemic infarct seen within the subcortical posterior left frontal lobe adjacent to the resection cavity (series 2, image 38). No other complication or adverse features. Remainder of the brain remains otherwise normal in appearance. No other mass lesion or abnormal enhancement. No hydrocephalus or significant extra-axial fluid collection. Vascular: Major intracranial vascular flow voids are maintained, with specific  note made of a preserved flow void within the posterior superior sagittal sinus. Skull and upper cervical spine: Post craniotomy changes at the left parietal calvarium with overlying scalp swelling and edema. Sinuses/Orbits: Unremarkable. Other: None. IMPRESSION: 1. Postoperative changes from interval left parietal craniotomy for tumor resection. No visible residual enhancing tumor with decreased localized FLAIR signal abnormality status post resection. 2. Small 1 cm acute ischemic infarct within the adjacent subcortical posterior left frontal region. 3. Otherwise stable and negative brain MRI. Electronically Signed   By: BJeannine BogaM.D.   On: 02/14/2021 02:01      IMPRESSION/PLAN: This is a wonderful 45year old gentleman with a high-grade glioma  Today, I talked to the patient about the findings and work-up thus far.  We discussed the patient's diagnosis of high-grade glioma and general treatment for this, highlighting the role of radiotherapy in the management.  We discussed the  available radiation techniques, and focused on the details of logistics and delivery.     I recommend that he receive 6-1/2 weeks of partial brain radiation and he will take temozolomide concurrently.  We discussed the risks, benefits, and side effects of radiotherapy. Side effects may include but not necessarily be limited to: Skin irritation, hair loss, fatigue, headaches, seizures, brain injury, cognitive decline; no guarantees of treatment were given. A consent form was signed and placed in the patient's medical record.  CT simulation will take place next week on September 9 and treatment will start approximately 1 week thereafter.  The patient was encouraged to ask questions that I answered to the best of my ability.  I look forward to participating in his care.  He will continue physical therapy as scheduled. I advised him to start [place] as prescribed for seizure prophylaxis.  On date of service, in total, I  spent 60 minutes on this encounter. Patient was seen in person.   __________________________________________   Eppie Gibson, MD This document serves as a record of services personally performed by Eppie Gibson, MD. It was created on her behalf by Roney Mans, a trained medical scribe. The creation of this record is based on the scribe's personal observations and the provider's statements to them. This document has been checked and approved by the attending provider.

## 2021-03-06 ENCOUNTER — Other Ambulatory Visit: Payer: Self-pay

## 2021-03-06 ENCOUNTER — Ambulatory Visit
Admission: RE | Admit: 2021-03-06 | Discharge: 2021-03-06 | Disposition: A | Discharge status: Home / Self Care | Payer: BC Managed Care – PPO | Source: Ambulatory Visit | Attending: Radiation Oncology | Admitting: Radiation Oncology

## 2021-03-06 ENCOUNTER — Encounter: Payer: Self-pay | Admitting: Radiation Oncology

## 2021-03-06 VITALS — BP 130/93 | HR 53 | Temp 97.9°F | Resp 20 | Ht 75.0 in | Wt 253.6 lb

## 2021-03-06 DIAGNOSIS — R42 Dizziness and giddiness: Secondary | ICD-10-CM | POA: Diagnosis not present

## 2021-03-06 DIAGNOSIS — R519 Headache, unspecified: Secondary | ICD-10-CM | POA: Insufficient documentation

## 2021-03-06 DIAGNOSIS — Z79899 Other long term (current) drug therapy: Secondary | ICD-10-CM | POA: Diagnosis not present

## 2021-03-06 DIAGNOSIS — R200 Anesthesia of skin: Secondary | ICD-10-CM | POA: Diagnosis not present

## 2021-03-06 DIAGNOSIS — E785 Hyperlipidemia, unspecified: Secondary | ICD-10-CM | POA: Insufficient documentation

## 2021-03-06 DIAGNOSIS — K219 Gastro-esophageal reflux disease without esophagitis: Secondary | ICD-10-CM | POA: Insufficient documentation

## 2021-03-06 DIAGNOSIS — J45909 Unspecified asthma, uncomplicated: Secondary | ICD-10-CM | POA: Insufficient documentation

## 2021-03-06 DIAGNOSIS — R270 Ataxia, unspecified: Secondary | ICD-10-CM | POA: Insufficient documentation

## 2021-03-06 DIAGNOSIS — M109 Gout, unspecified: Secondary | ICD-10-CM | POA: Insufficient documentation

## 2021-03-06 DIAGNOSIS — C719 Malignant neoplasm of brain, unspecified: Secondary | ICD-10-CM

## 2021-03-06 DIAGNOSIS — R531 Weakness: Secondary | ICD-10-CM | POA: Insufficient documentation

## 2021-03-06 DIAGNOSIS — C713 Malignant neoplasm of parietal lobe: Secondary | ICD-10-CM | POA: Diagnosis not present

## 2021-03-06 DIAGNOSIS — I6782 Cerebral ischemia: Secondary | ICD-10-CM | POA: Diagnosis not present

## 2021-03-06 DIAGNOSIS — M129 Arthropathy, unspecified: Secondary | ICD-10-CM | POA: Diagnosis not present

## 2021-03-10 ENCOUNTER — Encounter: Payer: Self-pay | Admitting: Physical Therapy

## 2021-03-10 ENCOUNTER — Ambulatory Visit: Payer: BC Managed Care – PPO | Attending: Neurological Surgery | Admitting: Physical Therapy

## 2021-03-10 ENCOUNTER — Other Ambulatory Visit: Payer: Self-pay

## 2021-03-10 VITALS — BP 123/78

## 2021-03-10 DIAGNOSIS — R5381 Other malaise: Secondary | ICD-10-CM | POA: Diagnosis present

## 2021-03-10 DIAGNOSIS — M542 Cervicalgia: Secondary | ICD-10-CM | POA: Insufficient documentation

## 2021-03-10 DIAGNOSIS — R262 Difficulty in walking, not elsewhere classified: Secondary | ICD-10-CM | POA: Insufficient documentation

## 2021-03-10 NOTE — Therapy (Signed)
[facility] [place], [place], [address] Phone: [phone]   Fax:  [phone]  Physical Therapy Treatment  Patient Details  Name: Edwin Moreno MRN: [id number] Date of Birth: 1976-05-11 Referring Provider (PT): Emelda Brothers MD   Encounter Date: 03/10/2021   PT End of Session - 03/10/21 0754     Visit Number 3    Number of Visits 4    Date for PT Re-Evaluation 03/23/21    PT Start Time 0738    PT Stop Time 0814   late arrival   PT Time Calculation (min) 36 min    Activity Tolerance Patient tolerated treatment well    Behavior During Therapy [facility] for tasks assessed/performed             Past Medical History:  Diagnosis Date   Arthritis    Asthma    CHILDHOOD HX   GERD (gastroesophageal reflux disease)    Gout    H/O removal of cyst    hip as a child   Hyperlipidemia     Past Surgical History:  Procedure Laterality Date   APPLICATION OF CRANIAL NAVIGATION N/A 02/13/2021   Procedure: APPLICATION OF CRANIAL NAVIGATION;  Surgeon: Judith Part, MD;  Location: [place];  Service: Neurosurgery;  Laterality: N/A;  RM 19   CRANIOTOMY Left 02/13/2021   Procedure: Left Craniotomy for tumor resection with brainlab;  Surgeon: Judith Part, MD;  Location: [facility];  Service: Neurosurgery;  Laterality: Left;    Vitals:   03/10/21 0800  BP: 123/78  SpO2: 98%     Subjective Assessment - 03/10/21 0746     Subjective COVID-19 screening performed upon arrival. Reports minimal headache today but having some pain L shoulder/neck. Also reported a R knee buckling experience while walking that stemmed from weakness.    Pertinent History s/p craniotomy, hx of CA - actie markers indicated    Limitations Reading;Other (comment)    How long can you sit comfortably? not limited    How long can you stand comfortably? "have not gauged that yet    How long can you walk comfortably? no problem    Patient Stated Goals To  be able to return to work without any issues (works as Youth worker in [place] )    Currently in Pain? Other (Comment)   "mild" headache               [facility] PT Assessment - 03/10/21 0001       Assessment   Medical Diagnosis Craniotomy with brain resection    Referring Provider (PT) Emelda Brothers MD    Onset Date/Surgical Date 02/13/21    Hand Dominance Right    Prior Therapy d/c from hospital      Precautions   Precautions Other (comment)                           [facility] Adult PT Treatment/Exercise - 03/10/21 0001       Exercises   Exercises Neck;Knee/Hip      Neck Exercises: Machines for Strengthening   UBE (Upper Arm Bike) 60 RPM x8 min (fordward/backward)    Nustep L4 x10 min      Knee/Hip Exercises: Machines for Strengthening   Cybex Knee Extension 20# 3x10 reps    Cybex Knee Flexion 40# 3x10 reps                 Balance Exercises -  03/10/21 0001       Balance Exercises: Standing   Standing Eyes Opened Narrow base of support (BOS);Foam/compliant surface;Time    Standing Eyes Opened Time 1 min    Standing Eyes Closed Narrow base of support (BOS);Foam/compliant surface;Time    Standing Eyes Closed Time x2 min    Tandem Stance Eyes open;Foam/compliant surface;Intermittent upper extremity support;Time    Tandem Stance Limitations x2 min                 PT Short Term Goals - 02/23/21 1605       PT SHORT TERM GOAL #1   Title Patient will be indep with an inital HEP    Baseline limited knoeldge of HEP    Time 1    Period Weeks    Status New    Target Date 03/02/21               PT Long Term Goals - 02/23/21 1606       PT LONG TERM GOAL #1   Title Patient will be indep with an advanced HEP    Time 4    Period Weeks    Status New    Target Date 03/23/21      PT LONG TERM GOAL #2   Title Patinet will be able to demonstrate normal reciprocal gait pattern for 6MWT within normative values.    Baseline not   assessed due to gout flare up in great toe    Time 4    Period Weeks    Status New    Target Date 03/23/21      PT LONG TERM GOAL #3   Title patient will be able to demonstrate excellent problem solving and functional planning through serier of [place] without limitations    Time 4    Period Weeks    Status New    Target Date 03/23/21                   Plan - 03/10/21 0819     Clinical Impression Statement Patient presented in clinic with biggest limitation of intermittant weakness of RLE. Patient able to tolerate all strengthening exercises well with no rush between reps or exercises. Patient encouraged to maintain normal breathing patterns. Patient did notice more RLE weakness from R hip down after therex session. Light balance activities completed as well on uneven surfaces which were more challenged by NBOS and eyes closed. Patient denied any cervical limitations today as he felt he limited himself being cautious.    Personal Factors and Comorbidities Social Background;Past/Current Experience    Examination-Activity Limitations Lift    Examination-Participation Restrictions Occupation;Driving    Stability/Clinical Decision Making Evolving/Moderate complexity    Rehab Potential Good    PT Frequency 1x / week    PT Duration 4 weeks    PT Treatment/Interventions Therapeutic activities;Neuromuscular re-education;Cognitive remediation;Balance training;Patient/family education;Vestibular;Therapeutic exercise    PT Next Visit Plan Follow up regarding two MD appts recommendations; cardiovascular endurance    Consulted and Agree with Plan of Care Patient             Patient will benefit from skilled therapeutic intervention in order to improve the following deficits and impairments:  Decreased activity tolerance, Other (comment), Impaired sensation, Decreased balance, Abnormal gait, Decreased endurance, Dizziness, Decreased scar mobility  Visit Diagnosis: Difficulty in  walking, not elsewhere classified  Cervicalgia  Physical deconditioning     Problem List Patient Active Problem List   Diagnosis Date Noted  Cancer of parietal lobe ([place]) 03/06/2021   Mass of parietal lobe 02/13/2021   Anaplastic oligodendroglioma, IDH mutant and 1p/19q-codeleted ([place]) 02/13/2021   Hypertriglyceridemia 09/18/2015   Gout 09/18/2015   Nodule, subcutaneous 05/02/2015    Standley Brooking, PTA 03/10/2021, 8:23 AM  [facility] [address] [place], [place], [address] Phone: [phone]   Fax:  [phone]  Name: NIKOLI NASSER MRN: [id number] Date of Birth: [birth year]

## 2021-03-12 ENCOUNTER — Encounter (HOSPITAL_COMMUNITY): Payer: Self-pay

## 2021-03-13 ENCOUNTER — Other Ambulatory Visit: Payer: Self-pay

## 2021-03-13 ENCOUNTER — Ambulatory Visit
Admission: RE | Admit: 2021-03-13 | Discharge: 2021-03-13 | Disposition: A | Discharge status: Home / Self Care | Payer: BC Managed Care – PPO | Source: Ambulatory Visit | Attending: Radiation Oncology | Admitting: Radiation Oncology

## 2021-03-13 DIAGNOSIS — Z510 Encounter for antineoplastic radiation therapy: Secondary | ICD-10-CM | POA: Diagnosis not present

## 2021-03-13 DIAGNOSIS — C713 Malignant neoplasm of parietal lobe: Secondary | ICD-10-CM | POA: Insufficient documentation

## 2021-03-16 ENCOUNTER — Telehealth: Payer: Self-pay | Admitting: Pharmacist

## 2021-03-16 ENCOUNTER — Other Ambulatory Visit: Payer: Self-pay | Admitting: Internal Medicine

## 2021-03-16 ENCOUNTER — Other Ambulatory Visit (HOSPITAL_COMMUNITY): Payer: Self-pay

## 2021-03-16 ENCOUNTER — Encounter: Payer: Self-pay | Admitting: Internal Medicine

## 2021-03-16 ENCOUNTER — Encounter: Payer: BC Managed Care – PPO | Admitting: Physical Therapy

## 2021-03-16 DIAGNOSIS — C719 Malignant neoplasm of brain, unspecified: Secondary | ICD-10-CM

## 2021-03-16 MED ORDER — ONDANSETRON HCL 8 MG PO TABS
8.0000 mg | ORAL_TABLET | Freq: Two times a day (BID) | ORAL | 1 refills | Status: DC | PRN
  Filled 2021-03-16: qty 30, 15d supply, fill #0
  Filled 2021-04-10: qty 30, 15d supply, fill #1

## 2021-03-16 MED ORDER — TEMOZOLOMIDE 180 MG PO CAPS
75.0000 mg/m2/d | ORAL_CAPSULE | Freq: Every day | ORAL | 0 refills | Status: DC
  Filled 2021-03-16: qty 45, 45d supply, fill #0
  Filled 2021-03-19: qty 28, 28d supply, fill #0
  Filled 2021-04-10: qty 14, 14d supply, fill #1

## 2021-03-16 NOTE — Telephone Encounter (Signed)
Oral Oncology Pharmacist Encounter  Received new prescription for Temodar (temozolomide) for the treatment of anaplastic oligodendroglioma, in conjunction with radiation, planned duration 42 days.  Prescription dose and frequency assessed for appropriateness. Appropriate for therapy initiation.   CBC and CMP from 02/09/21 assessed, no relevant lab abnormalities noted.   Current medication list in Epic reviewed, no relevant/significant DDIs with Temodar identified.  Evaluated chart and no patient barriers to medication adherence noted.   Patient agreement for treatment documented in MD note on 03/02/21.  Prescription has been e-scribed to the [facility] for benefits analysis and approval.  Oral Oncology Clinic will continue to follow for insurance authorization, copayment issues, initial counseling and start date.  Leron Croak, PharmD, BCPS Hematology/Oncology Clinical Pharmacist [facility] [phone] 03/16/2021 4:29 PM

## 2021-03-16 NOTE — Progress Notes (Signed)
START ON PATHWAY REGIMEN - Neuro     One cycle, concurrent with RT:     Temozolomide   **Always confirm dose/schedule in your pharmacy ordering system**  Patient Characteristics: Glioblastoma (Grade 4 Glioma), Newly Diagnosed / Treatment Naive, Good Performance Status and/or Younger Patient, MGMT Promoter Unmethylated/Unknown Disease Classification: Glioma Disease Classification: Glioblastoma (Grade 4 Glioma) Disease Status: Newly Diagnosed / Treatment Naive Performance Status: Good Performance Status and/or Younger Patient MGMT Promoter Methylation Status: Awaiting Test Results Intent of Therapy: Non-Curative / Palliative Intent, Discussed with Patient

## 2021-03-17 ENCOUNTER — Other Ambulatory Visit (HOSPITAL_COMMUNITY): Payer: Self-pay

## 2021-03-17 ENCOUNTER — Telehealth: Payer: Self-pay

## 2021-03-17 NOTE — Telephone Encounter (Signed)
Oral Oncology Patient Advocate Encounter   Received notification from [facility] [place] that prior authorization for Temodar is required.   PA submitted on CoverMyMeds Key [id number] Status is pending   Oral Oncology Clinic will continue to follow.  [place] Patient Edwin Moreno Phone [phone] Fax [phone] 03/17/2021 8:37 AM

## 2021-03-18 ENCOUNTER — Other Ambulatory Visit: Payer: Self-pay | Admitting: Internal Medicine

## 2021-03-18 ENCOUNTER — Other Ambulatory Visit (HOSPITAL_COMMUNITY): Payer: Self-pay

## 2021-03-18 MED ORDER — LORAZEPAM 0.5 MG PO TABS: 0.5000 mg | ORAL_TABLET | Freq: Every day | ORAL | 0 refills | Status: DC | PRN

## 2021-03-18 NOTE — Telephone Encounter (Signed)
Oral Chemotherapy Pharmacist Encounter  I spoke with patient for overview of: Temodar for the treatment of anaplastic oligodendroglioma in conjunction with radiation, planned duration concomitant phase 42 days of therapy.   Counseled patient on administration, dosing, side effects, monitoring, drug-food interactions, safe handling, storage, and disposal.  Patient will take Temodar '180mg'$  capsules, 180 mg total daily dose, by mouth once daily, may take at bedtime and on an empty stomach to decrease nausea and vomiting.  Patient will take Temodar concurrent with radiation for 42 days straight.  Temodar start date: 03/22/21 PM Radiation start date: 03/23/21   Patient will take Zofran '8mg'$  tablet, 1 tablet by mouth 30-60 min prior to Temodar dose to help decrease N/V once starting adjuvant therapy. Prophylactic Zofran will not be used at initiation of concurrent phase, but will be initiated if nausea develops despite Temodar administration on an empty stomach and at bedtime.   Adverse effects include but are not limited to: nausea, vomiting, anorexia, GI upset, rash, drug fever, and fatigue.  Rare but serious adverse effects of pneumocystis pneumonia and secondary malignancy also discussed. PCP prophylaxis will not be initiated at this time, but may be added based on lymphocyte count in the future.  Reviewed with patient importance of keeping a medication schedule and plan for any missed doses. No barriers to medication adherence identified.  Medication reconciliation performed and medication/allergy list updated.  Insurance authorization for Temodar has been obtained. Test claim at the pharmacy revealed copayment $100 for 1st fill of Temodar. Patient will pick this up from the [facility] on 03/20/21.  Patient informed the pharmacy will reach out 5-7 days prior to needing next fill of Temodar to coordinate continued medication acquisition to prevent break in therapy.   All  questions answered.  Mr. Lavery voiced understanding and appreciation.   Medication education handout placed in mail for patient. Patient knows to call the office with questions or concerns. Oral Chemotherapy Clinic phone number provided to patient.   Leron Croak, PharmD, BCPS Hematology/Oncology Clinical Pharmacist [facility] [phone] 03/18/2021 1:45 PM

## 2021-03-18 NOTE — Telephone Encounter (Signed)
Oral Oncology Patient Advocate Encounter  Prior Authorization for Temodar has been approved.    PA# [id number] Effective dates: 03/17/21 through 03/16/22  Patients co-pay is $100  Oral Oncology Clinic will continue to follow.   [place] Patient [place] Phone [phone] Fax [phone] 03/18/2021 8:56 AM

## 2021-03-19 ENCOUNTER — Other Ambulatory Visit: Payer: Self-pay | Admitting: Radiation Oncology

## 2021-03-19 ENCOUNTER — Other Ambulatory Visit (HOSPITAL_COMMUNITY): Payer: Self-pay

## 2021-03-20 ENCOUNTER — Other Ambulatory Visit: Payer: Self-pay | Admitting: Neurology

## 2021-03-20 DIAGNOSIS — R42 Dizziness and giddiness: Secondary | ICD-10-CM

## 2021-03-23 ENCOUNTER — Ambulatory Visit
Admission: RE | Admit: 2021-03-23 | Discharge: 2021-03-23 | Disposition: A | Discharge status: Home / Self Care | Payer: BC Managed Care – PPO | Source: Ambulatory Visit | Attending: Radiation Oncology | Admitting: Radiation Oncology

## 2021-03-23 ENCOUNTER — Other Ambulatory Visit: Payer: Self-pay

## 2021-03-23 DIAGNOSIS — C713 Malignant neoplasm of parietal lobe: Secondary | ICD-10-CM

## 2021-03-23 DIAGNOSIS — Z510 Encounter for antineoplastic radiation therapy: Secondary | ICD-10-CM | POA: Diagnosis not present

## 2021-03-23 LAB — SURGICAL PATHOLOGY

## 2021-03-23 MED ORDER — SONAFINE EX EMUL
1.0000 "application " | Freq: Two times a day (BID) | CUTANEOUS | Status: DC
  Administered 2021-03-23: 1 via TOPICAL

## 2021-03-23 NOTE — Progress Notes (Signed)
Pt here for patient teaching.    Pt given Radiation and You booklet, skin care instructions, and Sonafine.    Reviewed areas of pertinence such as fatigue, hair loss, nausea and vomiting, skin changes, headache, and blurry vision .   Pt able to give teach back of to pat skin, use unscented/gentle soap, and drink plenty of water,apply Sonafine bid and avoid applying anything to skin within 4 hours of treatment.   Pt demonstrated understanding and verbalizes understanding of information given and will contact nursing with any questions or concerns.    [email or web address]/treatmentinformation/whattoexpect/index

## 2021-03-24 ENCOUNTER — Ambulatory Visit
Admission: RE | Admit: 2021-03-24 | Discharge: 2021-03-24 | Disposition: A | Discharge status: Home / Self Care | Payer: BC Managed Care – PPO | Source: Ambulatory Visit | Attending: Radiation Oncology | Admitting: Radiation Oncology

## 2021-03-24 DIAGNOSIS — Z510 Encounter for antineoplastic radiation therapy: Secondary | ICD-10-CM | POA: Diagnosis not present

## 2021-03-25 ENCOUNTER — Other Ambulatory Visit: Payer: Self-pay

## 2021-03-25 ENCOUNTER — Ambulatory Visit
Admission: RE | Admit: 2021-03-25 | Discharge: 2021-03-25 | Disposition: A | Discharge status: Home / Self Care | Payer: BC Managed Care – PPO | Source: Ambulatory Visit | Attending: Radiation Oncology | Admitting: Radiation Oncology

## 2021-03-25 DIAGNOSIS — Z510 Encounter for antineoplastic radiation therapy: Secondary | ICD-10-CM | POA: Diagnosis not present

## 2021-03-26 ENCOUNTER — Ambulatory Visit
Admission: RE | Admit: 2021-03-26 | Discharge: 2021-03-26 | Disposition: A | Discharge status: Home / Self Care | Payer: BC Managed Care – PPO | Source: Ambulatory Visit | Attending: Radiation Oncology | Admitting: Radiation Oncology

## 2021-03-26 ENCOUNTER — Encounter (HOSPITAL_COMMUNITY): Payer: Self-pay

## 2021-03-26 DIAGNOSIS — Z510 Encounter for antineoplastic radiation therapy: Secondary | ICD-10-CM | POA: Diagnosis not present

## 2021-03-27 ENCOUNTER — Other Ambulatory Visit: Payer: Self-pay

## 2021-03-27 ENCOUNTER — Ambulatory Visit
Admission: RE | Admit: 2021-03-27 | Discharge: 2021-03-27 | Disposition: A | Discharge status: Home / Self Care | Payer: BC Managed Care – PPO | Source: Ambulatory Visit | Attending: Radiation Oncology | Admitting: Radiation Oncology

## 2021-03-27 DIAGNOSIS — Z510 Encounter for antineoplastic radiation therapy: Secondary | ICD-10-CM | POA: Diagnosis not present

## 2021-03-30 ENCOUNTER — Other Ambulatory Visit: Payer: Self-pay

## 2021-03-30 ENCOUNTER — Ambulatory Visit
Admission: RE | Admit: 2021-03-30 | Discharge: 2021-03-30 | Disposition: A | Discharge status: Home / Self Care | Payer: BC Managed Care – PPO | Source: Ambulatory Visit | Attending: Radiation Oncology | Admitting: Radiation Oncology

## 2021-03-30 DIAGNOSIS — Z510 Encounter for antineoplastic radiation therapy: Secondary | ICD-10-CM | POA: Diagnosis not present

## 2021-03-31 ENCOUNTER — Ambulatory Visit
Admission: RE | Admit: 2021-03-31 | Discharge: 2021-03-31 | Disposition: A | Discharge status: Home / Self Care | Payer: BC Managed Care – PPO | Source: Ambulatory Visit | Attending: Radiation Oncology | Admitting: Radiation Oncology

## 2021-03-31 ENCOUNTER — Other Ambulatory Visit: Payer: Self-pay

## 2021-03-31 DIAGNOSIS — Z510 Encounter for antineoplastic radiation therapy: Secondary | ICD-10-CM | POA: Diagnosis not present

## 2021-04-01 ENCOUNTER — Ambulatory Visit
Admission: RE | Admit: 2021-04-01 | Discharge: 2021-04-01 | Disposition: A | Discharge status: Home / Self Care | Payer: BC Managed Care – PPO | Source: Ambulatory Visit | Attending: Radiation Oncology | Admitting: Radiation Oncology

## 2021-04-01 DIAGNOSIS — Z510 Encounter for antineoplastic radiation therapy: Secondary | ICD-10-CM | POA: Diagnosis not present

## 2021-04-02 ENCOUNTER — Inpatient Hospital Stay: Payer: BC Managed Care – PPO

## 2021-04-02 ENCOUNTER — Inpatient Hospital Stay: Payer: BC Managed Care – PPO | Attending: Internal Medicine | Admitting: Internal Medicine

## 2021-04-02 ENCOUNTER — Other Ambulatory Visit: Payer: Self-pay

## 2021-04-02 ENCOUNTER — Ambulatory Visit
Admission: RE | Admit: 2021-04-02 | Discharge: 2021-04-02 | Disposition: A | Discharge status: Home / Self Care | Payer: BC Managed Care – PPO | Source: Ambulatory Visit | Attending: Radiation Oncology | Admitting: Radiation Oncology

## 2021-04-02 VITALS — BP 130/82 | HR 63 | Temp 97.8°F | Resp 18 | Ht 75.0 in | Wt 253.9 lb

## 2021-04-02 DIAGNOSIS — Z7289 Other problems related to lifestyle: Secondary | ICD-10-CM | POA: Diagnosis not present

## 2021-04-02 DIAGNOSIS — Z510 Encounter for antineoplastic radiation therapy: Secondary | ICD-10-CM | POA: Diagnosis not present

## 2021-04-02 DIAGNOSIS — C719 Malignant neoplasm of brain, unspecified: Secondary | ICD-10-CM | POA: Insufficient documentation

## 2021-04-02 DIAGNOSIS — Z79899 Other long term (current) drug therapy: Secondary | ICD-10-CM | POA: Diagnosis not present

## 2021-04-02 DIAGNOSIS — M109 Gout, unspecified: Secondary | ICD-10-CM | POA: Diagnosis not present

## 2021-04-02 DIAGNOSIS — Z87891 Personal history of nicotine dependence: Secondary | ICD-10-CM | POA: Diagnosis not present

## 2021-04-02 DIAGNOSIS — R42 Dizziness and giddiness: Secondary | ICD-10-CM | POA: Diagnosis not present

## 2021-04-02 LAB — CMP (CANCER CENTER ONLY)
ALT: 40 U/L (ref 0–44)
AST: 24 U/L (ref 15–41)
Albumin: 4.6 g/dL (ref 3.5–5.0)
Alkaline Phosphatase: 82 U/L (ref 38–126)
Anion gap: 12 (ref 5–15)
BUN: 13 mg/dL (ref 6–20)
CO2: 26 mmol/L (ref 22–32)
Calcium: 9.8 mg/dL (ref 8.9–10.3)
Chloride: 104 mmol/L (ref 98–111)
Creatinine: 1.13 mg/dL (ref 0.61–1.24)
GFR, Estimated: >60 mL/min (ref >60)
Glucose, Bld: 88 mg/dL (ref 70–99)
Potassium: 4 mmol/L (ref 3.5–5.1)
Sodium: 142 mmol/L (ref 135–145)
Total Bilirubin: 1 mg/dL (ref 0.3–1.2)
Total Protein: 7.5 g/dL (ref 6.5–8.1)

## 2021-04-02 LAB — CBC WITH DIFFERENTIAL (CANCER CENTER ONLY)
Abs Immature Granulocytes: 0.04 10*3/uL (ref 0.00–0.07)
Basophils Absolute: 0 10*3/uL (ref 0.0–0.1)
Basophils Relative: 0 %
Eosinophils Absolute: 0.1 10*3/uL (ref 0.0–0.5)
Eosinophils Relative: 1 %
HCT: 43.8 % (ref 39.0–52.0)
Hemoglobin: 15.1 g/dL (ref 13.0–17.0)
Immature Granulocytes: 1 %
Lymphocytes Relative: 30 %
Lymphs Abs: 2 10*3/uL (ref 0.7–4.0)
MCH: 32.3 pg (ref 26.0–34.0)
MCHC: 34.5 g/dL (ref 30.0–36.0)
MCV: 93.6 fL (ref 80.0–100.0)
Monocytes Absolute: 0.5 10*3/uL (ref 0.1–1.0)
Monocytes Relative: 7 %
Neutro Abs: 4.1 10*3/uL (ref 1.7–7.7)
Neutrophils Relative %: 61 %
Platelet Count: 255 10*3/uL (ref 150–400)
RBC: 4.68 MIL/uL (ref 4.22–5.81)
RDW: 12.2 % (ref 11.5–15.5)
WBC Count: 6.7 10*3/uL (ref 4.0–10.5)
nRBC: 0 % (ref 0.0–0.2)

## 2021-04-02 NOTE — Progress Notes (Signed)
[facility], [place] [address] [phone]   Interval Evaluation  Date of Service: 04/02/21 Patient Name: Edwin Moreno Patient MRN: [id number] Patient DOB: 1976-05-10 Provider: [name], MD  Identifying Statement:  Edwin Moreno is a 45 y.o. 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 Zada Finders; path is WHO 3 oligodendroglioma, IDH-mt   03/23/2021 -  Chemotherapy    Patient is on Treatment Plan: BRAIN GLIOBLASTOMA RADIATION THERAPY WITH CONCURRENT TEMOZOLOMIDE 75 MG/M2 DAILY FOLLOWED BY SEQUENTIAL MAINTENANCE TEMOZOLOMIDE X 6-12 CYCLES         Biomarkers:  MGMT Unknown.  IDH 1/2 Mutated.  1p/19q codeleted  TERT Unknown   Interval History: Edwin Moreno presents today for follow up, now having completed 2 weeks of IMRT and Temodar.  Denies new or progressive neurologic complaints.  No seizures or headaches.    H+P (03/02/21) Patient presented to medical attention this July with complaint of several years history of dizziness.  He described episodes of dizziness which would last for seconds to a minute, would come on "at any time".  They did/do not lead to any impairment or dyfunction.  Overall frequency had increased over the past months, to greater than once per week.  CNS imaging demonstrated an enhancing mass within the left frontal lobe, consistent with primary brain tumor.  He underwent craniotomy and resection with Dr. Zada Finders on 02/13/21; path demonstrates high grade glioma with IDH mutation.  Following surgery, he has not complained of dizzy episodes, or any neurologic issues.  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  Medications: Current Outpatient Medications on File Prior to Visit  Medication Sig Dispense Refill   allopurinol (ZYLOPRIM) 300 MG tablet Take 1 tablet (300 mg total) by mouth daily. (Needs to be seen before next refill) 30 tablet 0   Ascorbic Acid (VITAMIN C) 1000 MG tablet Take 1,000 mg by mouth daily.     cetirizine (ZYRTEC) 10 MG tablet Take 10 mg by mouth daily.     Cholecalciferol (VITAMIN D3) 50 MCG (2000 UT) TABS Take 2,000 Units by mouth daily.     indomethacin (INDOCIN SR) 75 MG CR capsule Take 75 mg by mouth 2 (two) times daily with a meal.     levETIRAcetam (KEPPRA) 250 MG tablet Take 1 tablet (250 mg total) by mouth 2 (two) times daily. 60 tablet 3   LORazepam (ATIVAN) 0.5 MG tablet Take 1 tablet (0.5 mg total) by mouth daily as needed for anxiety (Radiation sedation/claustrophobia). 40 tablet 0   omeprazole (PRILOSEC) 40 MG capsule TAKE (1) CAPSULE DAILY (Needs to be seen before next refill) (Patient taking differently: Take 40 mg by mouth daily.) 30 capsule 0   ondansetron (ZOFRAN) 8 MG tablet Take 1 tablet by mouth 2 times daily as needed (nausea and vomiting). May take 30 - 60 minutes prior to Temodar administration if nausea/vomiting occurs. 30 tablet 1   OVER THE COUNTER MEDICATION Take 1 tablet by mouth daily. Souris     pravastatin (PRAVACHOL) 40 MG tablet TAKE (1/2) TABLET DAILY. (Needs to be seen before next refill) (Patient taking differently: Take 20  mg by mouth daily.) 90 tablet 1   temozolomide (TEMODAR) 180 MG capsule Take 1 capsule (180 mg total) by mouth daily. May take on an empty stomach to decrease nausea & vomiting. 42 capsule 0   Glucosamine HCl 1500 MG TABS Take 1,500 mg by mouth daily. (Patient not taking: Reported on 04/02/2021)     Omega-3 Fatty Acids (FISH OIL) 1200 MG CAPS Take 1,200 mg by mouth daily. (Patient not taking: Reported on 04/02/2021)     zinc gluconate 50 MG tablet Take 50 mg by mouth daily.  (Patient not taking: Reported on 04/02/2021)     No current facility-administered medications on file prior to visit.    Allergies: No Known Allergies Past Medical History:  Past Medical History:  Diagnosis Date   Arthritis    Asthma    CHILDHOOD HX   GERD (gastroesophageal reflux disease)    Gout    H/O removal of cyst    hip as a child   Hyperlipidemia    Past Surgical History:  Past Surgical History:  Procedure Laterality Date   APPLICATION OF CRANIAL NAVIGATION N/A 02/13/2021   Procedure: APPLICATION OF CRANIAL NAVIGATION;  Surgeon: Judith Part, MD;  Location: [place] [facility];  Service: Neurosurgery;  Laterality: N/A;  RM 19   CRANIOTOMY Left 02/13/2021   Procedure: Left Craniotomy for tumor resection with brainlab;  Surgeon: Judith Part, MD;  Location: [facility];  Service: Neurosurgery;  Laterality: Left;   Social History:  Social History   Socioeconomic History   Marital status: Married    Spouse name: Not on file   Number of children: Not on file   Years of education: Not on file   Highest education level: Not on file  Occupational History   Not on file  Tobacco Use   Smoking status: Never   Smokeless tobacco: Former    Types: Nurse, children's Use: Never used  Substance and Sexual Activity   Alcohol use: Yes    Comment: 3-4 x weekly- beers   Drug use: No   Sexual activity: Yes  Other Topics Concern   Not on file  Social History Narrative   Not on file   Social Determinants of Health   Financial Resource Strain: Not on file  Food Insecurity: Not on file  Transportation Needs: Not on file  Physical Activity: Not on file  Stress: Not on file  Social Connections: Not on file  Intimate Partner Violence: Not on file   Family History: No family history on file.  Review of Systems: Constitutional: Doesn't report fevers, chills or abnormal weight loss Eyes: Doesn't report blurriness of vision Ears, nose, mouth, throat, and face: Doesn't report  sore throat Respiratory: Doesn't report cough, dyspnea or wheezes Cardiovascular: Doesn't report palpitation, chest discomfort  Gastrointestinal:  Doesn't report nausea, constipation, diarrhea GU: Doesn't report incontinence Skin: Doesn't report skin rashes Neurological: Per HPI Musculoskeletal: Doesn't report joint pain Behavioral/Psych: Doesn't report anxiety  Physical Exam: Vitals:   04/02/21 1436  BP: 130/82  Pulse: 63  Resp: 18  Temp: 97.8 F (36.6 C)  SpO2: 100%   KPS: 90. General: Alert, cooperative, pleasant, in no acute distress Head: Normal EENT: No conjunctival injection or scleral icterus.  Lungs: Resp effort normal Cardiac: Regular rate Abdomen: Non-distended abdomen Skin: No rashes cyanosis or petechiae. Extremities: No clubbing or edema  Neurologic Exam: Mental Status: Awake, alert, attentive to examiner. Oriented to self and environment. Language is fluent with intact comprehension.  Cranial Nerves: Visual acuity is grossly normal. Visual fields are full. Extra-ocular movements intact. No ptosis. Face is symmetric Motor: Tone and bulk are normal. Power is full in both arms and legs. Reflexes are symmetric, no pathologic reflexes present.  Sensory: Intact to light touch Gait: Normal.   Labs: I have reviewed the data as listed    Component Value Date/Time   NA 141 02/13/2021 1033   NA 141 12/19/2018 1048   K 5.1 02/13/2021 1033   CL 103 02/09/2021 0900   CO2 28 02/09/2021 0900   GLUCOSE 92 02/09/2021 0900   BUN 11 02/09/2021 0900   BUN 15 12/19/2018 1048   CREATININE 1.12 02/13/2021 1255   CALCIUM 9.4 02/09/2021 0900   PROT 6.9 12/19/2018 1048   ALBUMIN 5.0 12/19/2018 1048   AST 33 12/19/2018 1048   ALT 34 12/19/2018 1048   ALKPHOS 71 12/19/2018 1048   BILITOT 1.1 12/19/2018 1048   GFRNONAA >60 02/13/2021 1255   GFRAA 102 12/19/2018 1048   Lab Results  Component Value Date   WBC 6.7 04/02/2021   NEUTROABS 4.1 04/02/2021   HGB 15.1  04/02/2021   HCT 43.8 04/02/2021   MCV 93.6 04/02/2021   PLT 255 04/02/2021    Assessment/Plan Anaplastic oligodendroglioma, IDH mutant and 1p/19q-codeleted ([place])  YOTAM RHINE is clinically stable today, now having completed 2 weeks of IMRT and Temozolomide.    We ultimately recommended proceeding with course of intensity modulated radiation therapy and concurrent daily Temozolomide.  Radiation will be administered Mon-Fri over 6 weeks, Temodar will be dosed at 36m/m2 to be given daily over 42 days.  We reviewed side effects of temodar, including fatigue, nausea/vomiting, constipation, and cytopenias.  Chemotherapy should be held for the following:  ANC less than 1,000  Platelets less than 100,000  LFT or creatinine greater than 2x ULN  If clinical concerns/contraindications develop  Every 2 weeks during radiation, labs will be checked accompanied by a clinical evaluation in the brain tumor clinic.  Keppra should con't 2575mBID.  All questions were answered. The patient knows to call the clinic with any problems, questions or concerns. No barriers to learning were detected.  The total time spent in the encounter was 30 minutes and more than 50% was on counseling and review of test results   ZaVentura SellersMD Medical Director of Neuro-Oncology [facility] We[facility]9/29/22 2:45 PM

## 2021-04-03 ENCOUNTER — Ambulatory Visit
Admission: RE | Admit: 2021-04-03 | Discharge: 2021-04-03 | Disposition: A | Discharge status: Home / Self Care | Payer: BC Managed Care – PPO | Source: Ambulatory Visit | Attending: Radiation Oncology | Admitting: Radiation Oncology

## 2021-04-03 DIAGNOSIS — Z510 Encounter for antineoplastic radiation therapy: Secondary | ICD-10-CM | POA: Diagnosis not present

## 2021-04-06 ENCOUNTER — Ambulatory Visit
Admission: RE | Admit: 2021-04-06 | Discharge: 2021-04-06 | Disposition: A | Discharge status: Home / Self Care | Payer: BC Managed Care – PPO | Source: Ambulatory Visit | Attending: Radiation Oncology | Admitting: Radiation Oncology

## 2021-04-06 ENCOUNTER — Other Ambulatory Visit: Payer: Self-pay

## 2021-04-06 DIAGNOSIS — R42 Dizziness and giddiness: Secondary | ICD-10-CM | POA: Diagnosis not present

## 2021-04-06 DIAGNOSIS — C713 Malignant neoplasm of parietal lobe: Secondary | ICD-10-CM | POA: Diagnosis present

## 2021-04-06 DIAGNOSIS — Z87891 Personal history of nicotine dependence: Secondary | ICD-10-CM | POA: Diagnosis not present

## 2021-04-06 DIAGNOSIS — Z79899 Other long term (current) drug therapy: Secondary | ICD-10-CM | POA: Diagnosis not present

## 2021-04-06 DIAGNOSIS — K5900 Constipation, unspecified: Secondary | ICD-10-CM | POA: Diagnosis not present

## 2021-04-06 DIAGNOSIS — Z510 Encounter for antineoplastic radiation therapy: Secondary | ICD-10-CM | POA: Insufficient documentation

## 2021-04-06 DIAGNOSIS — R5383 Other fatigue: Secondary | ICD-10-CM | POA: Diagnosis not present

## 2021-04-07 ENCOUNTER — Ambulatory Visit
Admission: RE | Admit: 2021-04-07 | Discharge: 2021-04-07 | Disposition: A | Discharge status: Home / Self Care | Payer: BC Managed Care – PPO | Source: Ambulatory Visit | Attending: Radiation Oncology | Admitting: Radiation Oncology

## 2021-04-07 DIAGNOSIS — Z510 Encounter for antineoplastic radiation therapy: Secondary | ICD-10-CM | POA: Diagnosis not present

## 2021-04-08 ENCOUNTER — Ambulatory Visit
Admission: RE | Admit: 2021-04-08 | Discharge: 2021-04-08 | Disposition: A | Discharge status: Home / Self Care | Payer: BC Managed Care – PPO | Source: Ambulatory Visit | Attending: Radiation Oncology | Admitting: Radiation Oncology

## 2021-04-08 ENCOUNTER — Other Ambulatory Visit: Payer: Self-pay

## 2021-04-08 DIAGNOSIS — Z510 Encounter for antineoplastic radiation therapy: Secondary | ICD-10-CM | POA: Diagnosis not present

## 2021-04-09 ENCOUNTER — Ambulatory Visit
Admission: RE | Admit: 2021-04-09 | Discharge: 2021-04-09 | Disposition: A | Discharge status: Home / Self Care | Payer: BC Managed Care – PPO | Source: Ambulatory Visit | Attending: Radiation Oncology | Admitting: Radiation Oncology

## 2021-04-09 DIAGNOSIS — Z510 Encounter for antineoplastic radiation therapy: Secondary | ICD-10-CM | POA: Diagnosis not present

## 2021-04-10 ENCOUNTER — Ambulatory Visit
Admission: RE | Admit: 2021-04-10 | Discharge: 2021-04-10 | Disposition: A | Discharge status: Home / Self Care | Payer: BC Managed Care – PPO | Source: Ambulatory Visit | Attending: Radiation Oncology | Admitting: Radiation Oncology

## 2021-04-10 ENCOUNTER — Other Ambulatory Visit (HOSPITAL_COMMUNITY): Payer: Self-pay

## 2021-04-10 DIAGNOSIS — Z510 Encounter for antineoplastic radiation therapy: Secondary | ICD-10-CM | POA: Diagnosis not present

## 2021-04-13 ENCOUNTER — Ambulatory Visit
Admission: RE | Admit: 2021-04-13 | Discharge: 2021-04-13 | Disposition: A | Discharge status: Home / Self Care | Payer: BC Managed Care – PPO | Source: Ambulatory Visit | Attending: Radiation Oncology | Admitting: Radiation Oncology

## 2021-04-13 ENCOUNTER — Other Ambulatory Visit (HOSPITAL_COMMUNITY): Payer: Self-pay

## 2021-04-13 ENCOUNTER — Other Ambulatory Visit: Payer: Self-pay

## 2021-04-13 DIAGNOSIS — Z510 Encounter for antineoplastic radiation therapy: Secondary | ICD-10-CM | POA: Diagnosis not present

## 2021-04-14 ENCOUNTER — Ambulatory Visit
Admission: RE | Admit: 2021-04-14 | Discharge: 2021-04-14 | Disposition: A | Discharge status: Home / Self Care | Payer: BC Managed Care – PPO | Source: Ambulatory Visit | Attending: Radiation Oncology | Admitting: Radiation Oncology

## 2021-04-14 DIAGNOSIS — Z510 Encounter for antineoplastic radiation therapy: Secondary | ICD-10-CM | POA: Diagnosis not present

## 2021-04-15 ENCOUNTER — Ambulatory Visit
Admission: RE | Admit: 2021-04-15 | Discharge: 2021-04-15 | Disposition: A | Discharge status: Home / Self Care | Payer: BC Managed Care – PPO | Source: Ambulatory Visit | Attending: Radiation Oncology | Admitting: Radiation Oncology

## 2021-04-15 ENCOUNTER — Other Ambulatory Visit (HOSPITAL_COMMUNITY): Payer: Self-pay

## 2021-04-15 ENCOUNTER — Other Ambulatory Visit: Payer: Self-pay

## 2021-04-15 DIAGNOSIS — Z510 Encounter for antineoplastic radiation therapy: Secondary | ICD-10-CM | POA: Diagnosis not present

## 2021-04-16 ENCOUNTER — Ambulatory Visit
Admission: RE | Admit: 2021-04-16 | Discharge: 2021-04-16 | Disposition: A | Discharge status: Home / Self Care | Payer: BC Managed Care – PPO | Source: Ambulatory Visit | Attending: Radiation Oncology | Admitting: Radiation Oncology

## 2021-04-16 ENCOUNTER — Inpatient Hospital Stay: Payer: BC Managed Care – PPO | Attending: Internal Medicine | Admitting: Internal Medicine

## 2021-04-16 ENCOUNTER — Inpatient Hospital Stay: Payer: BC Managed Care – PPO

## 2021-04-16 VITALS — BP 121/85 | HR 62 | Temp 98.0°F | Resp 17 | Wt 258.8 lb

## 2021-04-16 DIAGNOSIS — Z87891 Personal history of nicotine dependence: Secondary | ICD-10-CM | POA: Insufficient documentation

## 2021-04-16 DIAGNOSIS — C719 Malignant neoplasm of brain, unspecified: Secondary | ICD-10-CM | POA: Insufficient documentation

## 2021-04-16 DIAGNOSIS — R5383 Other fatigue: Secondary | ICD-10-CM | POA: Insufficient documentation

## 2021-04-16 DIAGNOSIS — K5900 Constipation, unspecified: Secondary | ICD-10-CM | POA: Insufficient documentation

## 2021-04-16 DIAGNOSIS — R42 Dizziness and giddiness: Secondary | ICD-10-CM | POA: Insufficient documentation

## 2021-04-16 DIAGNOSIS — C713 Malignant neoplasm of parietal lobe: Secondary | ICD-10-CM | POA: Insufficient documentation

## 2021-04-16 DIAGNOSIS — Z510 Encounter for antineoplastic radiation therapy: Secondary | ICD-10-CM | POA: Diagnosis not present

## 2021-04-16 DIAGNOSIS — Z79899 Other long term (current) drug therapy: Secondary | ICD-10-CM | POA: Insufficient documentation

## 2021-04-16 LAB — CBC WITH DIFFERENTIAL (CANCER CENTER ONLY)
Abs Immature Granulocytes: 0.03 10*3/uL (ref 0.00–0.07)
Basophils Absolute: 0 10*3/uL (ref 0.0–0.1)
Basophils Relative: 0 %
Eosinophils Absolute: 0.1 10*3/uL (ref 0.0–0.5)
Eosinophils Relative: 2 %
HCT: 44.7 % (ref 39.0–52.0)
Hemoglobin: 15.6 g/dL (ref 13.0–17.0)
Immature Granulocytes: 1 %
Lymphocytes Relative: 21 %
Lymphs Abs: 1.2 10*3/uL (ref 0.7–4.0)
MCH: 32.9 pg (ref 26.0–34.0)
MCHC: 34.9 g/dL (ref 30.0–36.0)
MCV: 94.3 fL (ref 80.0–100.0)
Monocytes Absolute: 0.4 10*3/uL (ref 0.1–1.0)
Monocytes Relative: 7 %
Neutro Abs: 4 10*3/uL (ref 1.7–7.7)
Neutrophils Relative %: 69 %
Platelet Count: 263 10*3/uL (ref 150–400)
RBC: 4.74 MIL/uL (ref 4.22–5.81)
RDW: 12.3 % (ref 11.5–15.5)
WBC Count: 5.7 10*3/uL (ref 4.0–10.5)
nRBC: 0 % (ref 0.0–0.2)

## 2021-04-16 NOTE — Progress Notes (Signed)
[facility] at [place], [place] [id number] [phone]   Interval Evaluation  Date of Service: 04/16/21 Patient Name: Edwin Moreno Patient MRN: [id number] Patient DOB: [birth year] Provider: [name], MD  Identifying Statement:  Edwin Moreno is a 45 y.o. 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 Zada Finders; path is WHO 3 oligodendroglioma, IDH-mt   03/23/2021 -  Chemotherapy    Patient is on Treatment Plan: BRAIN GLIOBLASTOMA RADIATION THERAPY WITH CONCURRENT TEMOZOLOMIDE 75 MG/M2 DAILY FOLLOWED BY SEQUENTIAL MAINTENANCE TEMOZOLOMIDE X 6-12 CYCLES         Biomarkers:  MGMT Unknown.  IDH 1/2 Mutated.  1p/19q codeleted  TERT Unknown   Interval History: Edwin Moreno presents today for follow up, now having completed 4 weeks of IMRT and Temodar.  Denies new or progressive neurologic complaints.  Is having some fatigue and constipation with the chemo. No seizures or headaches.    H+P (03/02/21) Patient presented to medical attention this July with complaint of several years history of dizziness.  He described episodes of dizziness which would last for seconds to a minute, would come on "at any time".  They did/do not lead to any impairment or dyfunction.  Overall frequency had increased over the past months, to greater than once per week.  CNS imaging demonstrated an enhancing mass within the left frontal lobe, consistent with primary brain tumor.  He underwent craniotomy and resection with Dr. Zada Finders on 02/13/21; path demonstrates high grade glioma with IDH mutation.  Following surgery, he has not complained of dizzy episodes,  or any neurologic issues.  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  Medications: Current Outpatient Medications on File Prior to Visit  Medication Sig Dispense Refill   allopurinol (ZYLOPRIM) 300 MG tablet Take 1 tablet (300 mg total) by mouth daily. (Needs to be seen before next refill) 30 tablet 0   cetirizine (ZYRTEC) 10 MG tablet Take 10 mg by mouth daily.     Cholecalciferol (VITAMIN D3) 50 MCG (2000 UT) TABS Take 2,000 Units by mouth daily.     indomethacin (INDOCIN SR) 75 MG CR capsule Take 75 mg by mouth 2 (two) times daily with a meal.     levETIRAcetam (KEPPRA) 250 MG tablet Take 1 tablet (250 mg total) by mouth 2 (two) times daily. 60 tablet 3   LORazepam (ATIVAN) 0.5 MG tablet Take 1 tablet (0.5 mg total) by mouth daily as needed for anxiety (Radiation sedation/claustrophobia). 40 tablet 0   omeprazole (PRILOSEC) 40 MG capsule TAKE (1) CAPSULE DAILY (Needs to be seen before next refill) (Patient taking differently: Take 40 mg by mouth daily.) 30 capsule 0   ondansetron (ZOFRAN) 8 MG tablet Take 1 tablet by mouth 2 times daily as needed (nausea and vomiting). May take 30 - 60 minutes prior to Temodar administration if nausea/vomiting occurs. 30 tablet 1   pravastatin (PRAVACHOL) 40 MG tablet TAKE (1/2) TABLET DAILY. (Needs to be seen before next refill) (Patient taking differently: Take 20 mg by mouth daily.) 90 tablet 1   temozolomide (TEMODAR) 180 MG capsule Take 1 capsule (180 mg total) by mouth daily. May take on  an empty stomach to decrease nausea & vomiting. 42 capsule 0   Ascorbic Acid (VITAMIN C) 1000 MG tablet Take 1,000 mg by mouth daily. (Patient not taking: Reported on 04/16/2021)     Glucosamine HCl 1500 MG TABS Take 1,500 mg by mouth daily. (Patient not taking: No sig reported)     Omega-3 Fatty Acids (FISH OIL) 1200 MG CAPS Take 1,200 mg by mouth daily. (Patient not taking: No sig reported)     OVER THE COUNTER MEDICATION Take 1 tablet by mouth  daily. [place] (Patient not taking: Reported on 04/16/2021)     zinc gluconate 50 MG tablet Take 50 mg by mouth daily. (Patient not taking: No sig reported)     No current facility-administered medications on file prior to visit.    Allergies: No Known Allergies Past Medical History:  Past Medical History:  Diagnosis Date   Arthritis    Asthma    CHILDHOOD HX   GERD (gastroesophageal reflux disease)    Gout    H/O removal of cyst    hip as a child   Hyperlipidemia    Past Surgical History:  Past Surgical History:  Procedure Laterality Date   APPLICATION OF CRANIAL NAVIGATION N/A 02/13/2021   Procedure: APPLICATION OF CRANIAL NAVIGATION;  Surgeon: Judith Part, MD;  Location: [facility];  Service: Neurosurgery;  Laterality: N/A;  RM 19   CRANIOTOMY Left 02/13/2021   Procedure: Left Craniotomy for tumor resection with brainlab;  Surgeon: Judith Part, MD;  Location: [facility];  Service: Neurosurgery;  Laterality: Left;   Social History:  Social History   Socioeconomic History   Marital status: Married    Spouse name: Not on file   Number of children: Not on file   Years of education: Not on file   Highest education level: Not on file  Occupational History   Not on file  Tobacco Use   Smoking status: Never   Smokeless tobacco: Former    Types: Nurse, children's Use: Never used  Substance and Sexual Activity   Alcohol use: Yes    Comment: 3-4 x weekly- beers   Drug use: No   Sexual activity: Yes  Other Topics Concern   Not on file  Social History Narrative   Not on file   Social Determinants of Health   Financial Resource Strain: Not on file  Food Insecurity: Not on file  Transportation Needs: Not on file  Physical Activity: Not on file  Stress: Not on file  Social Connections: Not on file  Intimate Partner Violence: Not on file   Family History: No family history on file.  Review of Systems: Constitutional: Doesn't report fevers,  chills or abnormal weight loss Eyes: Doesn't report blurriness of vision Ears, nose, mouth, throat, and face: Doesn't report sore throat Respiratory: Doesn't report cough, dyspnea or wheezes Cardiovascular: Doesn't report palpitation, chest discomfort  Gastrointestinal:  Doesn't report nausea, constipation, diarrhea GU: Doesn't report incontinence Skin: Doesn't report skin rashes Neurological: Per HPI Musculoskeletal: Doesn't report joint pain Behavioral/Psych: Doesn't report anxiety  Physical Exam: Vitals:   04/16/21 1443  BP: 121/85  Pulse: 62  Resp: 17  Temp: 98 F (36.7 C)  SpO2: 100%   KPS: 90. General: Alert, cooperative, pleasant, in no acute distress Head: Normal EENT: No conjunctival injection or scleral icterus.  Lungs: Resp effort normal Cardiac: Regular rate Abdomen: Non-distended abdomen Skin: No rashes cyanosis or petechiae. Extremities: No clubbing or edema  Neurologic Exam: Mental Status: Awake, alert, attentive to examiner. Oriented to self and environment. Language is fluent with intact comprehension.  Cranial Nerves: Visual acuity is grossly normal. Visual fields are full. Extra-ocular movements intact. No ptosis. Face is symmetric Motor: Tone and bulk are normal. Power is full in both arms and legs. Reflexes are symmetric, no pathologic reflexes present.  Sensory: Intact to light touch Gait: Normal.   Labs: I have reviewed the data as listed    Component Value Date/Time   NA 142 04/02/2021 1430   NA 141 12/19/2018 1048   K 4.0 04/02/2021 1430   CL 104 04/02/2021 1430   CO2 26 04/02/2021 1430   GLUCOSE 88 04/02/2021 1430   BUN 13 04/02/2021 1430   BUN 15 12/19/2018 1048   CREATININE 1.13 04/02/2021 1430   CALCIUM 9.8 04/02/2021 1430   PROT 7.5 04/02/2021 1430   PROT 6.9 12/19/2018 1048   ALBUMIN 4.6 04/02/2021 1430   ALBUMIN 5.0 12/19/2018 1048   AST 24 04/02/2021 1430   ALT 40 04/02/2021 1430   ALKPHOS 82 04/02/2021 1430   BILITOT 1.0  04/02/2021 1430   GFRNONAA >60 04/02/2021 1430   GFRAA 102 12/19/2018 1048   Lab Results  Component Value Date   WBC 5.7 04/16/2021   NEUTROABS 4.0 04/16/2021   HGB 15.6 04/16/2021   HCT 44.7 04/16/2021   MCV 94.3 04/16/2021   PLT 263 04/16/2021    Assessment/Plan Anaplastic oligodendroglioma, IDH mutant and 1p/19q-codeleted ([place])  Edwin Moreno is clinically stable today, now having completed 4 weeks of IMRT and Temozolomide.  No new or progressive deficits today, labs are within normal limits.    We ultimately recommended proceeding with course of intensity modulated radiation therapy and concurrent daily Temozolomide.  Radiation will be administered Mon-Fri over 6 weeks, Temodar will be dosed at 9m/m2 to be given daily over 42 days.  We reviewed side effects of temodar, including fatigue, nausea/vomiting, constipation, and cytopenias.  Chemotherapy should be held for the following:  ANC less than 1,000  Platelets less than 100,000  LFT or creatinine greater than 2x ULN  If clinical concerns/contraindications develop  Every 2 weeks during radiation, labs will be checked accompanied by a clinical evaluation in the brain tumor clinic.  Keppra should con't 2513mBID.  All questions were answered. The patient knows to call the clinic with any problems, questions or concerns. No barriers to learning were detected.  The total time spent in the encounter was 30 minutes and more than 50% was on counseling and review of test results   Edwin SellersMD Medical Director of Neuro-Oncology [facility]0/13/22 2:51 PM

## 2021-04-17 ENCOUNTER — Encounter: Payer: Self-pay | Admitting: Internal Medicine

## 2021-04-17 ENCOUNTER — Ambulatory Visit
Admission: RE | Admit: 2021-04-17 | Discharge: 2021-04-17 | Disposition: A | Discharge status: Home / Self Care | Payer: BC Managed Care – PPO | Source: Ambulatory Visit | Attending: Radiation Oncology | Admitting: Radiation Oncology

## 2021-04-17 DIAGNOSIS — Z510 Encounter for antineoplastic radiation therapy: Secondary | ICD-10-CM | POA: Diagnosis not present

## 2021-04-17 LAB — CMP (CANCER CENTER ONLY)
ALT: 61 U/L — ABNORMAL HIGH (ref 0–44)
AST: 35 U/L (ref 15–41)
Albumin: 4.7 g/dL (ref 3.5–5.0)
Alkaline Phosphatase: 77 U/L (ref 38–126)
Anion gap: 8 (ref 5–15)
BUN: 14 mg/dL (ref 6–20)
CO2: 29 mmol/L (ref 22–32)
Calcium: 9.3 mg/dL (ref 8.9–10.3)
Chloride: 102 mmol/L (ref 98–111)
Creatinine: 1.2 mg/dL (ref 0.61–1.24)
GFR, Estimated: >60 mL/min (ref >60)
Glucose, Bld: 92 mg/dL (ref 70–99)
Potassium: 4.4 mmol/L (ref 3.5–5.1)
Sodium: 139 mmol/L (ref 135–145)
Total Bilirubin: 0.7 mg/dL (ref 0.3–1.2)
Total Protein: 7.4 g/dL (ref 6.5–8.1)

## 2021-04-20 ENCOUNTER — Ambulatory Visit
Admission: RE | Admit: 2021-04-20 | Discharge: 2021-04-20 | Disposition: A | Discharge status: Home / Self Care | Payer: BC Managed Care – PPO | Source: Ambulatory Visit | Attending: Radiation Oncology | Admitting: Radiation Oncology

## 2021-04-20 ENCOUNTER — Other Ambulatory Visit: Payer: Self-pay

## 2021-04-20 DIAGNOSIS — Z510 Encounter for antineoplastic radiation therapy: Secondary | ICD-10-CM | POA: Diagnosis not present

## 2021-04-21 ENCOUNTER — Ambulatory Visit
Admission: RE | Admit: 2021-04-21 | Discharge: 2021-04-21 | Disposition: A | Discharge status: Home / Self Care | Payer: BC Managed Care – PPO | Source: Ambulatory Visit | Attending: Radiation Oncology | Admitting: Radiation Oncology

## 2021-04-21 DIAGNOSIS — Z510 Encounter for antineoplastic radiation therapy: Secondary | ICD-10-CM | POA: Diagnosis not present

## 2021-04-22 ENCOUNTER — Ambulatory Visit
Admission: RE | Admit: 2021-04-22 | Discharge: 2021-04-22 | Disposition: A | Discharge status: Home / Self Care | Payer: BC Managed Care – PPO | Source: Ambulatory Visit | Attending: Radiation Oncology | Admitting: Radiation Oncology

## 2021-04-22 ENCOUNTER — Other Ambulatory Visit: Payer: Self-pay

## 2021-04-22 DIAGNOSIS — Z510 Encounter for antineoplastic radiation therapy: Secondary | ICD-10-CM | POA: Diagnosis not present

## 2021-04-23 ENCOUNTER — Ambulatory Visit
Admission: RE | Admit: 2021-04-23 | Discharge: 2021-04-23 | Disposition: A | Discharge status: Home / Self Care | Payer: BC Managed Care – PPO | Source: Ambulatory Visit | Attending: Radiation Oncology | Admitting: Radiation Oncology

## 2021-04-23 ENCOUNTER — Telehealth: Payer: Self-pay | Admitting: *Deleted

## 2021-04-23 DIAGNOSIS — Z510 Encounter for antineoplastic radiation therapy: Secondary | ICD-10-CM | POA: Diagnosis not present

## 2021-04-23 NOTE — Telephone Encounter (Signed)
Patient called to ask about discrepancy in count of tablets.  States he will be short 3 tablets at the end of his treatment.  Need to find out if he should get a new RX for those last 3 tablets.    Routed to Dr Mickeal Skinner to advise.

## 2021-04-24 ENCOUNTER — Other Ambulatory Visit: Payer: Self-pay

## 2021-04-24 ENCOUNTER — Ambulatory Visit
Admission: RE | Admit: 2021-04-24 | Discharge: 2021-04-24 | Disposition: A | Discharge status: Home / Self Care | Payer: BC Managed Care – PPO | Source: Ambulatory Visit | Attending: Radiation Oncology | Admitting: Radiation Oncology

## 2021-04-24 DIAGNOSIS — Z510 Encounter for antineoplastic radiation therapy: Secondary | ICD-10-CM | POA: Diagnosis not present

## 2021-04-27 ENCOUNTER — Ambulatory Visit
Admission: RE | Admit: 2021-04-27 | Discharge: 2021-04-27 | Disposition: A | Discharge status: Home / Self Care | Payer: BC Managed Care – PPO | Source: Ambulatory Visit | Attending: Radiation Oncology | Admitting: Radiation Oncology

## 2021-04-27 ENCOUNTER — Other Ambulatory Visit: Payer: Self-pay

## 2021-04-27 DIAGNOSIS — Z510 Encounter for antineoplastic radiation therapy: Secondary | ICD-10-CM | POA: Diagnosis not present

## 2021-04-28 ENCOUNTER — Other Ambulatory Visit: Payer: Self-pay

## 2021-04-28 ENCOUNTER — Ambulatory Visit
Admission: RE | Admit: 2021-04-28 | Discharge: 2021-04-28 | Disposition: A | Discharge status: Home / Self Care | Payer: BC Managed Care – PPO | Source: Ambulatory Visit | Attending: Radiation Oncology | Admitting: Radiation Oncology

## 2021-04-28 DIAGNOSIS — Z510 Encounter for antineoplastic radiation therapy: Secondary | ICD-10-CM | POA: Diagnosis not present

## 2021-04-29 ENCOUNTER — Ambulatory Visit
Admission: RE | Admit: 2021-04-29 | Discharge: 2021-04-29 | Disposition: A | Discharge status: Home / Self Care | Payer: BC Managed Care – PPO | Source: Ambulatory Visit | Attending: Radiation Oncology | Admitting: Radiation Oncology

## 2021-04-29 DIAGNOSIS — Z510 Encounter for antineoplastic radiation therapy: Secondary | ICD-10-CM | POA: Diagnosis not present

## 2021-04-30 ENCOUNTER — Inpatient Hospital Stay (HOSPITAL_BASED_OUTPATIENT_CLINIC_OR_DEPARTMENT_OTHER): Payer: BC Managed Care – PPO | Admitting: Internal Medicine

## 2021-04-30 ENCOUNTER — Ambulatory Visit
Admission: RE | Admit: 2021-04-30 | Discharge: 2021-04-30 | Disposition: A | Discharge status: Home / Self Care | Payer: BC Managed Care – PPO | Source: Ambulatory Visit | Attending: Radiation Oncology | Admitting: Radiation Oncology

## 2021-04-30 ENCOUNTER — Other Ambulatory Visit: Payer: Self-pay

## 2021-04-30 ENCOUNTER — Telehealth: Payer: Self-pay | Admitting: Internal Medicine

## 2021-04-30 ENCOUNTER — Inpatient Hospital Stay: Payer: BC Managed Care – PPO

## 2021-04-30 VITALS — BP 129/91 | HR 84 | Temp 98.4°F | Resp 18 | Ht 75.0 in | Wt 252.6 lb

## 2021-04-30 DIAGNOSIS — Z510 Encounter for antineoplastic radiation therapy: Secondary | ICD-10-CM | POA: Diagnosis not present

## 2021-04-30 DIAGNOSIS — C719 Malignant neoplasm of brain, unspecified: Secondary | ICD-10-CM

## 2021-04-30 LAB — CBC WITH DIFFERENTIAL (CANCER CENTER ONLY)
Abs Immature Granulocytes: 0.03 10*3/uL (ref 0.00–0.07)
Basophils Absolute: 0 10*3/uL (ref 0.0–0.1)
Basophils Relative: 1 %
Eosinophils Absolute: 0.1 10*3/uL (ref 0.0–0.5)
Eosinophils Relative: 1 %
HCT: 47.1 % (ref 39.0–52.0)
Hemoglobin: 16.4 g/dL (ref 13.0–17.0)
Immature Granulocytes: 1 %
Lymphocytes Relative: 19 %
Lymphs Abs: 1.1 10*3/uL (ref 0.7–4.0)
MCH: 32.5 pg (ref 26.0–34.0)
MCHC: 34.8 g/dL (ref 30.0–36.0)
MCV: 93.3 fL (ref 80.0–100.0)
Monocytes Absolute: 0.3 10*3/uL (ref 0.1–1.0)
Monocytes Relative: 6 %
Neutro Abs: 4.3 10*3/uL (ref 1.7–7.7)
Neutrophils Relative %: 72 %
Platelet Count: 281 10*3/uL (ref 150–400)
RBC: 5.05 MIL/uL (ref 4.22–5.81)
RDW: 12.2 % (ref 11.5–15.5)
WBC Count: 5.8 10*3/uL (ref 4.0–10.5)
nRBC: 0 % (ref 0.0–0.2)

## 2021-04-30 LAB — CMP (CANCER CENTER ONLY)
ALT: 78 U/L — ABNORMAL HIGH (ref 0–44)
AST: 40 U/L (ref 15–41)
Albumin: 4.7 g/dL (ref 3.5–5.0)
Alkaline Phosphatase: 88 U/L (ref 38–126)
Anion gap: 12 (ref 5–15)
BUN: 14 mg/dL (ref 6–20)
CO2: 24 mmol/L (ref 22–32)
Calcium: 9.7 mg/dL (ref 8.9–10.3)
Chloride: 106 mmol/L (ref 98–111)
Creatinine: 1.14 mg/dL (ref 0.61–1.24)
GFR, Estimated: >60 mL/min (ref >60)
Glucose, Bld: 98 mg/dL (ref 70–99)
Potassium: 4.3 mmol/L (ref 3.5–5.1)
Sodium: 142 mmol/L (ref 135–145)
Total Bilirubin: 1.2 mg/dL (ref 0.3–1.2)
Total Protein: 7.9 g/dL (ref 6.5–8.1)

## 2021-04-30 MED ORDER — DEXAMETHASONE 2 MG PO TABS: 2.0000 mg | ORAL_TABLET | Freq: Every day | ORAL | 1 refills | Status: DC

## 2021-04-30 NOTE — Telephone Encounter (Signed)
Scheduled per 10/27 los, pt has been called and confirmed appt

## 2021-04-30 NOTE — Progress Notes (Signed)
[facility] at [place], [place] [address] [phone]   Interval Evaluation  Date of Service: 04/30/21 Patient Name: Edwin Moreno Patient MRN: [id number] Patient DOB: Feb 23, 1976 Provider: [name], MD  Identifying Statement:  Edwin Moreno is a 45 y.o. 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 Zada Finders; path is WHO 3 oligodendroglioma, IDH-mt   03/23/2021 -  Chemotherapy    Patient is on Treatment Plan: BRAIN GLIOBLASTOMA RADIATION THERAPY WITH CONCURRENT TEMOZOLOMIDE 75 MG/M2 DAILY FOLLOWED BY SEQUENTIAL MAINTENANCE TEMOZOLOMIDE X 6-12 CYCLES         Biomarkers:  MGMT Unknown.  IDH 1/2 Mutated.  1p/19q codeleted  TERT Unknown   Interval History: Edwin Moreno presents today for follow up, now in final week of IMRT and Temodar. He describes worsening of right leg numbness today, it is now involving most of the leg.  Also describes some visual "auras" in the right visual field without any blackout of vision.  Is still having some fatigue and constipation with the chemo. No seizures or headaches.    H+P (03/02/21) Patient presented to medical attention this July with complaint of several years history of dizziness.  He described episodes of dizziness which would last for seconds to a minute, would come on "at any time".  They did/do not lead to any impairment or dyfunction.  Overall frequency had increased over the past months, to greater than once per week.  CNS imaging demonstrated an enhancing mass within the left frontal lobe, consistent with primary brain tumor.  He underwent craniotomy and resection with Dr. Zada Finders  on 02/13/21; path demonstrates high grade glioma with IDH mutation.  Following surgery, he has not complained of dizzy episodes, or any neurologic issues.  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  Medications: Current Outpatient Medications on File Prior to Visit  Medication Sig Dispense Refill   allopurinol (ZYLOPRIM) 300 MG tablet Take 1 tablet (300 mg total) by mouth daily. (Needs to be seen before next refill) 30 tablet 0   Ascorbic Acid (VITAMIN C) 1000 MG tablet Take 1,000 mg by mouth daily. (Patient not taking: Reported on 04/16/2021)     cetirizine (ZYRTEC) 10 MG tablet Take 10 mg by mouth daily.     Cholecalciferol (VITAMIN D3) 50 MCG (2000 UT) TABS Take 2,000 Units by mouth daily.     Glucosamine HCl 1500 MG TABS Take 1,500 mg by mouth daily. (Patient not taking: No sig reported)     indomethacin (INDOCIN SR) 75 MG CR capsule Take 75 mg by mouth 2 (two) times daily with a meal.     levETIRAcetam (KEPPRA) 250 MG tablet Take 1 tablet (250 mg total) by mouth 2 (two) times daily. 60 tablet 3   LORazepam (ATIVAN) 0.5 MG tablet Take 1 tablet (0.5 mg total) by mouth daily as needed for anxiety (Radiation sedation/claustrophobia). 40 tablet 0   Omega-3 Fatty Acids (FISH OIL) 1200 MG CAPS Take 1,200 mg by mouth daily. (Patient not taking: No sig reported)     omeprazole (PRILOSEC) 40 MG capsule TAKE (1) CAPSULE DAILY (Needs to be seen before next refill) (Patient taking differently: Take  40 mg by mouth daily.) 30 capsule 0   ondansetron (ZOFRAN) 8 MG tablet Take 1 tablet by mouth 2 times daily as needed (nausea and vomiting). May take 30 - 60 minutes prior to Temodar administration if nausea/vomiting occurs. 30 tablet 1   OVER THE COUNTER MEDICATION Take 1 tablet by mouth daily. [place] (Patient not taking: Reported on 04/16/2021)     pravastatin (PRAVACHOL) 40 MG tablet TAKE (1/2) TABLET DAILY. (Needs to be seen before next refill) (Patient taking differently:  Take 20 mg by mouth daily.) 90 tablet 1   temozolomide (TEMODAR) 180 MG capsule Take 1 capsule (180 mg total) by mouth daily. May take on an empty stomach to decrease nausea & vomiting. 42 capsule 0   zinc gluconate 50 MG tablet Take 50 mg by mouth daily. (Patient not taking: No sig reported)     No current facility-administered medications on file prior to visit.    Allergies: No Known Allergies Past Medical History:  Past Medical History:  Diagnosis Date   Arthritis    Asthma    CHILDHOOD HX   GERD (gastroesophageal reflux disease)    Gout    H/O removal of cyst    hip as a child   Hyperlipidemia    Past Surgical History:  Past Surgical History:  Procedure Laterality Date   APPLICATION OF CRANIAL NAVIGATION N/A 02/13/2021   Procedure: APPLICATION OF CRANIAL NAVIGATION;  Surgeon: Judith Part, MD;  Location: [facility];  Service: Neurosurgery;  Laterality: N/A;  RM 19   CRANIOTOMY Left 02/13/2021   Procedure: Left Craniotomy for tumor resection with brainlab;  Surgeon: Judith Part, MD;  Location: [facility];  Service: Neurosurgery;  Laterality: Left;   Social History:  Social History   Socioeconomic History   Marital status: Married    Spouse name: Not on file   Number of children: Not on file   Years of education: Not on file   Highest education level: Not on file  Occupational History   Not on file  Tobacco Use   Smoking status: Never   Smokeless tobacco: Former    Types: Nurse, children's Use: Never used  Substance and Sexual Activity   Alcohol use: Yes    Comment: 3-4 x weekly- beers   Drug use: No   Sexual activity: Yes  Other Topics Concern   Not on file  Social History Narrative   Not on file   Social Determinants of Health   Financial Resource Strain: Not on file  Food Insecurity: Not on file  Transportation Needs: Not on file  Physical Activity: Not on file  Stress: Not on file  Social Connections: Not on file  Intimate Partner  Violence: Not on file   Family History: No family history on file.  Review of Systems: Constitutional: Doesn't report fevers, chills or abnormal weight loss Eyes: Doesn't report blurriness of vision Ears, nose, mouth, throat, and face: Doesn't report sore throat Respiratory: Doesn't report cough, dyspnea or wheezes Cardiovascular: Doesn't report palpitation, chest discomfort  Gastrointestinal:  Doesn't report nausea, constipation, diarrhea GU: Doesn't report incontinence Skin: Doesn't report skin rashes Neurological: Per HPI Musculoskeletal: Doesn't report joint pain Behavioral/Psych: Doesn't report anxiety  Physical Exam: Vitals:   04/30/21 1438  BP: (!) 129/91  Pulse: 84  Resp: 18  Temp: 98.4 F (36.9 C)  SpO2: 100%    KPS: 90. General: Alert, cooperative, pleasant, in no acute distress Head: Normal EENT: No  conjunctival injection or scleral icterus.  Lungs: Resp effort normal Cardiac: Regular rate Abdomen: Non-distended abdomen Skin: No rashes cyanosis or petechiae. Extremities: No clubbing or edema  Neurologic Exam: Mental Status: Awake, alert, attentive to examiner. Oriented to self and environment. Language is fluent with intact comprehension.  Cranial Nerves: Visual acuity is grossly normal. Visual fields are full. Extra-ocular movements intact. No ptosis. Face is symmetric Motor: Tone and bulk are normal. Power is full in both arms and legs. Reflexes are symmetric, no pathologic reflexes present.  Sensory: Intact to light touch Gait: Normal.   Labs: I have reviewed the data as listed    Component Value Date/Time   NA 139 04/16/2021 1429   NA 141 12/19/2018 1048   K 4.4 04/16/2021 1429   CL 102 04/16/2021 1429   CO2 29 04/16/2021 1429   GLUCOSE 92 04/16/2021 1429   BUN 14 04/16/2021 1429   BUN 15 12/19/2018 1048   CREATININE 1.20 04/16/2021 1429   CALCIUM 9.3 04/16/2021 1429   PROT 7.4 04/16/2021 1429   PROT 6.9 12/19/2018 1048   ALBUMIN 4.7  04/16/2021 1429   ALBUMIN 5.0 12/19/2018 1048   AST 35 04/16/2021 1429   ALT 61 (H) 04/16/2021 1429   ALKPHOS 77 04/16/2021 1429   BILITOT 0.7 04/16/2021 1429   GFRNONAA >60 04/16/2021 1429   GFRAA 102 12/19/2018 1048   Lab Results  Component Value Date   WBC 5.8 04/30/2021   NEUTROABS 4.3 04/30/2021   HGB 16.4 04/30/2021   HCT 47.1 04/30/2021   MCV 93.3 04/30/2021   PLT 281 04/30/2021    Assessment/Plan Anaplastic oligodendroglioma, IDH mutant and 1p/19q-codeleted ([place])  [place] presents today with modest focal changes, now in final week of IMRT and Temozolomide.  Etiology is likely acute radioinflammatory process.  We recommended trial of dexamethasone 72m daily x3 days, followed by 211mdaily thereafter.  We ultimately recommended completing course of intensity modulated radiation therapy and concurrent daily Temozolomide.  Radiation will be administered Mon-Fri over 6 weeks, Temodar will be dosed at 7573m2 to be given daily over 42 days.  We reviewed side effects of temodar, including fatigue, nausea/vomiting, constipation, and cytopenias.  Chemotherapy should be held for the following:  ANC less than 1,000  Platelets less than 100,000  LFT or creatinine greater than 2x ULN  If clinical concerns/contraindications develop  Keppra should con't 250m58mD.  We ask that JasoSHEDRICK SARLIurn to clinic in 1 months following post-RT brain MRI, or sooner as needed.  All questions were answered. The patient knows to call the clinic with any problems, questions or concerns. No barriers to learning were detected.  The total time spent in the encounter was 30 minutes and more than 50% was on counseling and review of test results   ZachVentura Sellers Medical Director of Neuro-Oncology [facility]aWesl[facility]istoga27/22 2:36 PM

## 2021-05-01 ENCOUNTER — Ambulatory Visit
Admission: RE | Admit: 2021-05-01 | Discharge: 2021-05-01 | Disposition: A | Discharge status: Home / Self Care | Payer: BC Managed Care – PPO | Source: Ambulatory Visit | Attending: Radiation Oncology | Admitting: Radiation Oncology

## 2021-05-01 DIAGNOSIS — Z510 Encounter for antineoplastic radiation therapy: Secondary | ICD-10-CM | POA: Diagnosis not present

## 2021-05-04 ENCOUNTER — Ambulatory Visit
Admission: RE | Admit: 2021-05-04 | Discharge: 2021-05-04 | Disposition: A | Discharge status: Home / Self Care | Payer: BC Managed Care – PPO | Source: Ambulatory Visit | Attending: Radiation Oncology | Admitting: Radiation Oncology

## 2021-05-04 ENCOUNTER — Other Ambulatory Visit: Payer: Self-pay | Admitting: Radiation Oncology

## 2021-05-04 ENCOUNTER — Other Ambulatory Visit: Payer: Self-pay

## 2021-05-04 DIAGNOSIS — Z510 Encounter for antineoplastic radiation therapy: Secondary | ICD-10-CM | POA: Diagnosis not present

## 2021-05-05 ENCOUNTER — Ambulatory Visit
Admission: RE | Admit: 2021-05-05 | Discharge: 2021-05-05 | Disposition: A | Discharge status: Home / Self Care | Payer: BC Managed Care – PPO | Source: Ambulatory Visit | Attending: Radiation Oncology | Admitting: Radiation Oncology

## 2021-05-05 DIAGNOSIS — Z510 Encounter for antineoplastic radiation therapy: Secondary | ICD-10-CM | POA: Diagnosis not present

## 2021-05-05 DIAGNOSIS — C713 Malignant neoplasm of parietal lobe: Secondary | ICD-10-CM | POA: Diagnosis present

## 2021-05-06 ENCOUNTER — Other Ambulatory Visit: Payer: Self-pay

## 2021-05-06 ENCOUNTER — Ambulatory Visit
Admission: RE | Admit: 2021-05-06 | Discharge: 2021-05-06 | Disposition: A | Discharge status: Home / Self Care | Payer: BC Managed Care – PPO | Source: Ambulatory Visit | Attending: Radiation Oncology | Admitting: Radiation Oncology

## 2021-05-06 ENCOUNTER — Other Ambulatory Visit (HOSPITAL_COMMUNITY): Payer: Self-pay

## 2021-05-06 ENCOUNTER — Encounter: Payer: Self-pay | Admitting: Radiation Oncology

## 2021-05-06 DIAGNOSIS — Z510 Encounter for antineoplastic radiation therapy: Secondary | ICD-10-CM | POA: Diagnosis not present

## 2021-05-07 ENCOUNTER — Other Ambulatory Visit: Payer: Self-pay | Admitting: Radiation Therapy

## 2021-06-05 ENCOUNTER — Ambulatory Visit: Payer: Self-pay | Admitting: Radiation Oncology

## 2021-06-05 ENCOUNTER — Ambulatory Visit
Admission: RE | Admit: 2021-06-05 | Discharge: 2021-06-05 | Disposition: A | Discharge status: Home / Self Care | Payer: BC Managed Care – PPO | Source: Ambulatory Visit | Attending: Internal Medicine | Admitting: Internal Medicine

## 2021-06-05 DIAGNOSIS — C719 Malignant neoplasm of brain, unspecified: Secondary | ICD-10-CM

## 2021-06-05 MED ORDER — GADOBENATE DIMEGLUMINE 529 MG/ML IV SOLN
20.0000 mL | Freq: Once | INTRAVENOUS | Status: AC | PRN
  Administered 2021-06-05: 20 mL via INTRAVENOUS

## 2021-06-08 ENCOUNTER — Other Ambulatory Visit: Payer: Self-pay

## 2021-06-08 ENCOUNTER — Inpatient Hospital Stay: Payer: BC Managed Care – PPO | Attending: Internal Medicine | Admitting: Internal Medicine

## 2021-06-08 ENCOUNTER — Telehealth: Payer: Self-pay | Admitting: Pharmacist

## 2021-06-08 ENCOUNTER — Ambulatory Visit
Admission: RE | Admit: 2021-06-08 | Discharge: 2021-06-08 | Disposition: A | Discharge status: Home / Self Care | Payer: BC Managed Care – PPO | Source: Ambulatory Visit | Attending: Radiation Oncology | Admitting: Radiation Oncology

## 2021-06-08 ENCOUNTER — Inpatient Hospital Stay: Payer: BC Managed Care – PPO

## 2021-06-08 ENCOUNTER — Other Ambulatory Visit (HOSPITAL_COMMUNITY): Payer: Self-pay

## 2021-06-08 VITALS — BP 132/84 | HR 68 | Temp 98.1°F | Resp 20 | Wt 260.6 lb

## 2021-06-08 DIAGNOSIS — C719 Malignant neoplasm of brain, unspecified: Secondary | ICD-10-CM | POA: Diagnosis not present

## 2021-06-08 DIAGNOSIS — Z7952 Long term (current) use of systemic steroids: Secondary | ICD-10-CM | POA: Diagnosis not present

## 2021-06-08 DIAGNOSIS — Z79630 Long term (current) use of alkylating agent: Secondary | ICD-10-CM | POA: Insufficient documentation

## 2021-06-08 DIAGNOSIS — Z79899 Other long term (current) drug therapy: Secondary | ICD-10-CM | POA: Diagnosis not present

## 2021-06-08 DIAGNOSIS — R42 Dizziness and giddiness: Secondary | ICD-10-CM | POA: Diagnosis not present

## 2021-06-08 DIAGNOSIS — C713 Malignant neoplasm of parietal lobe: Secondary | ICD-10-CM | POA: Diagnosis present

## 2021-06-08 DIAGNOSIS — R519 Headache, unspecified: Secondary | ICD-10-CM | POA: Insufficient documentation

## 2021-06-08 MED ORDER — TEMOZOLOMIDE 180 MG PO CAPS
150.0000 mg/m2/d | ORAL_CAPSULE | Freq: Every day | ORAL | 0 refills | Status: AC
  Filled 2021-06-08: qty 10, 5d supply, fill #0
  Filled 2021-06-09: qty 10, 28d supply, fill #0

## 2021-06-08 MED ORDER — ONDANSETRON HCL 8 MG PO TABS
8.0000 mg | ORAL_TABLET | Freq: Two times a day (BID) | ORAL | 1 refills | Status: DC | PRN
  Filled 2021-06-08 – 2021-06-09 (×2): qty 30, 15d supply, fill #0
  Filled 2021-07-02: qty 30, 15d supply, fill #1

## 2021-06-08 NOTE — Progress Notes (Signed)
DISCONTINUE ON PATHWAY REGIMEN - Neuro     One cycle, concurrent with RT:     Temozolomide   **Always confirm dose/schedule in your pharmacy ordering system**  REASON: Continuation Of Treatment PRIOR TREATMENT: [id number]: Radiation Therapy with Concurrent Temozolomide 75 mg/m2 Daily x 6 Weeks, Followed by Adjuvant Temozolomide TREATMENT RESPONSE: Stable Disease (SD)  START ON PATHWAY REGIMEN - Neuro     A cycle is every 28 days:     Temozolomide      Temozolomide   **Always confirm dose/schedule in your pharmacy ordering system**  Patient Characteristics: Glioblastoma (Grade 4 Glioma), Newly Diagnosed / Treatment Naive, Good Performance Status and/or Younger Patient, MGMT Promoter Unmethylated/Unknown Disease Classification: Glioma Disease Classification: Glioblastoma (Grade 4 Glioma) Disease Status: Newly Diagnosed / Treatment Naive Performance Status: Good Performance Status and/or Younger Patient MGMT Promoter Methylation Status: Awaiting Test Results Intent of Therapy: Non-Curative / Palliative Intent, Discussed with Patient

## 2021-06-08 NOTE — Progress Notes (Signed)
Mr. Albee presents today for follow-up after completing radiation on 05/06/2021  Dose of Decadron, if applicable: Discontinued on his own about 10 days ago due to issues with over eating/not being able to sense when he's full (which would cause him to get nauseous/sick)  Recent neurologic symptoms, if any:  Seizures: Patient denies Headaches: Yes--reports constant mild head aches that are typically located on the left side of his head and radiate down the back of his neck Nausea: Patient denies Dizziness/ataxia: Reports occasionally feeling unsteady on his feet due to decreased sensation to his right foot. Denies any falls, but does report feeling unbalanced at times if terrain is uneven because he has difficulty judging given numbness to right foot Difficulty with hand coordination: Patient denies Focal numbness/weakness: Continued numbness/ decreased feeling to right foot/leg; Reports it seems to be receeding, and is no longer above the knee Visual deficits/changes: No new issues/concerns. Continued blurriness to right eye Confusion/Memory deficits: Patient denies  Additional Complaints / other details: Also had F/U with Dr. Mickeal Skinner today

## 2021-06-08 NOTE — Telephone Encounter (Addendum)
Oral Oncology Pharmacist Encounter  Received new prescription for Temodar (temozolomide) for the treatment of anaplastic oligodendroglioma, planned duration ~6-12 cycles.  CMP and CBC w/ Diff from 04/30/21 assessed, noted ALT 78 U/L, all other LFTs WNL, T. Bili 1.2 mg/dL. No baseline dose adjustments required. Prescription dose and frequency assessed for appropriateness. Appropriate for therapy initiation.   Current medication list in Epic reviewed, no relevant/significant DDIs with Temodar identified.  Evaluated chart and no patient barriers to medication adherence noted.   Patient agreement for treatment documented in MD note on 06/08/21.  Prescription has been e-scribed to the [facility]. Prior authorization already on file for patient and approved through 03/05/22. Patient's copay is $0.  Oral Oncology Clinic will continue to follow for initial counseling and start date.  Leron Croak, PharmD, BCPS Hematology/Oncology Clinical Pharmacist [facility] and [place] [phone] 06/08/2021 2:29 PM

## 2021-06-08 NOTE — Progress Notes (Signed)
[facility] [address], [place] [address] [phone]   Interval Evaluation  Date of Service: 06/08/21 Patient Name: Edwin Moreno Patient MRN: [id number] Patient DOB: 1976/03/24 Provider: [name], MD  Identifying Statement:  Edwin Moreno is a 45 y.o. 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 Zada Finders; path is WHO 3 oligodendroglioma, IDH-mt   03/23/2021 -  Chemotherapy    Patient is on Treatment Plan: BRAIN GLIOBLASTOMA RADIATION THERAPY WITH CONCURRENT TEMOZOLOMIDE 75 MG/M2 DAILY FOLLOWED BY SEQUENTIAL MAINTENANCE TEMOZOLOMIDE X 6-12 CYCLES         Biomarkers:  MGMT Unknown.  IDH 1/2 Mutated.  1p/19q codeleted  TERT Unknown   Interval History: [name] presents today for follow up, now having completed IMRT and Temodar. No recent changes with regards to right leg numbness.  Had one seizure after forgetting to take Keppra.  Headaches are sporadic.   H+P (03/02/21) Patient presented to medical attention this July with complaint of several years history of dizziness.  He described episodes of dizziness which would last for seconds to a minute, would come on "at any time".  They did/do not lead to any impairment or dyfunction.  Overall frequency had increased over the past months, to greater than once per week.  CNS imaging demonstrated an enhancing mass within the left frontal lobe, consistent with primary brain tumor.  He underwent craniotomy and resection with Dr. Zada Finders on 02/13/21; path demonstrates high grade glioma with IDH mutation.  Following surgery, he has not complained of dizzy episodes, or any  neurologic issues.  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  Medications: Current Outpatient Medications on File Prior to Visit  Medication Sig Dispense Refill   allopurinol (ZYLOPRIM) 300 MG tablet Take 1 tablet (300 mg total) by mouth daily. (Needs to be seen before next refill) 30 tablet 0   Ascorbic Acid (VITAMIN C) 1000 MG tablet Take 1,000 mg by mouth daily. (Patient not taking: No sig reported)     cetirizine (ZYRTEC) 10 MG tablet Take 10 mg by mouth daily.     Cholecalciferol (VITAMIN D3) 50 MCG (2000 UT) TABS Take 2,000 Units by mouth daily.     dexamethasone (DECADRON) 2 MG tablet Take 1 tablet (2 mg total) by mouth daily. 60 tablet 1   Glucosamine HCl 1500 MG TABS Take 1,500 mg by mouth daily. (Patient not taking: No sig reported)     indomethacin (INDOCIN SR) 75 MG CR capsule Take 75 mg by mouth 2 (two) times daily with a meal.     levETIRAcetam (KEPPRA) 250 MG tablet Take 1 tablet (250 mg total) by mouth 2 (two) times daily. 60 tablet 3   LORazepam (ATIVAN) 0.5 MG tablet Take 1 tablet (0.5 mg total) by mouth daily as needed for anxiety (Radiation sedation/claustrophobia). 40 tablet 0   Omega-3 Fatty Acids (FISH OIL) 1200 MG CAPS Take 1,200 mg by mouth daily. (Patient not taking: No sig reported)     omeprazole (PRILOSEC) 40 MG capsule TAKE (1) CAPSULE DAILY (Needs to be seen before next refill) (Patient taking differently: Take 40 mg by mouth daily.) 30 capsule 0  ondansetron (ZOFRAN) 8 MG tablet Take 1 tablet by mouth 2 times daily as needed (nausea and vomiting). May take 30 - 60 minutes prior to Temodar administration if nausea/vomiting occurs. 30 tablet 1   OVER THE COUNTER MEDICATION Take 1 tablet by mouth daily. [place] (Patient not taking: No sig reported)     pravastatin (PRAVACHOL) 40 MG tablet TAKE (1/2) TABLET DAILY. (Needs to be seen before next refill) (Patient taking differently: Take 20 mg by mouth daily.) 90 tablet 1    temozolomide (TEMODAR) 180 MG capsule Take 1 capsule (180 mg total) by mouth daily. May take on an empty stomach to decrease nausea & vomiting. 42 capsule 0   zinc gluconate 50 MG tablet Take 50 mg by mouth daily. (Patient not taking: No sig reported)     No current facility-administered medications on file prior to visit.    Allergies: No Known Allergies Past Medical History:  Past Medical History:  Diagnosis Date   Arthritis    Asthma    CHILDHOOD HX   GERD (gastroesophageal reflux disease)    Gout    H/O removal of cyst    hip as a child   Hyperlipidemia    Past Surgical History:  Past Surgical History:  Procedure Laterality Date   APPLICATION OF CRANIAL NAVIGATION N/A 02/13/2021   Procedure: APPLICATION OF CRANIAL NAVIGATION;  Surgeon: Judith Part, MD;  Location: [facility];  Service: Neurosurgery;  Laterality: N/A;  RM 19   CRANIOTOMY Left 02/13/2021   Procedure: Left Craniotomy for tumor resection with brainlab;  Surgeon: Judith Part, MD;  Location: [facility];  Service: Neurosurgery;  Laterality: Left;   Social History:  Social History   Socioeconomic History   Marital status: Married    Spouse name: Not on file   Number of children: Not on file   Years of education: Not on file   Highest education level: Not on file  Occupational History   Not on file  Tobacco Use   Smoking status: Never   Smokeless tobacco: Former    Types: Nurse, children's Use: Never used  Substance and Sexual Activity   Alcohol use: Yes    Comment: 3-4 x weekly- beers   Drug use: No   Sexual activity: Yes  Other Topics Concern   Not on file  Social History Narrative   Not on file   Social Determinants of Health   Financial Resource Strain: Not on file  Food Insecurity: Not on file  Transportation Needs: Not on file  Physical Activity: Not on file  Stress: Not on file  Social Connections: Not on file  Intimate Partner Violence: Not on file   Family History: No  family history on file.  Review of Systems: Constitutional: Doesn't report fevers, chills or abnormal weight loss Eyes: Doesn't report blurriness of vision Ears, nose, mouth, throat, and face: Doesn't report sore throat Respiratory: Doesn't report cough, dyspnea or wheezes Cardiovascular: Doesn't report palpitation, chest discomfort  Gastrointestinal:  Doesn't report nausea, constipation, diarrhea GU: Doesn't report incontinence Skin: Doesn't report skin rashes Neurological: Per HPI Musculoskeletal: Doesn't report joint pain Behavioral/Psych: Doesn't report anxiety  Physical Exam: Vitals:   06/08/21 1240  BP: 132/84  Pulse: 68  Resp: 20  Temp: 98.1 F (36.7 C)  SpO2: 99%    KPS: 90. General: Alert, cooperative, pleasant, in no acute distress Head: Normal EENT: No conjunctival injection or scleral icterus.  Lungs: Resp effort normal Cardiac:  Regular rate Abdomen: Non-distended abdomen Skin: No rashes cyanosis or petechiae. Extremities: No clubbing or edema  Neurologic Exam: Mental Status: Awake, alert, attentive to examiner. Oriented to self and environment. Language is fluent with intact comprehension.  Cranial Nerves: Visual acuity is grossly normal. Visual fields are full. Extra-ocular movements intact. No ptosis. Face is symmetric Motor: Tone and bulk are normal. Power is full in both arms and legs. Reflexes are symmetric, no pathologic reflexes present.  Sensory: Intact to light touch Gait: Normal.   Labs: I have reviewed the data as listed    Component Value Date/Time   NA 142 04/30/2021 1429   NA 141 12/19/2018 1048   K 4.3 04/30/2021 1429   CL 106 04/30/2021 1429   CO2 24 04/30/2021 1429   GLUCOSE 98 04/30/2021 1429   BUN 14 04/30/2021 1429   BUN 15 12/19/2018 1048   CREATININE 1.14 04/30/2021 1429   CALCIUM 9.7 04/30/2021 1429   PROT 7.9 04/30/2021 1429   PROT 6.9 12/19/2018 1048   ALBUMIN 4.7 04/30/2021 1429   ALBUMIN 5.0 12/19/2018 1048   AST 40  04/30/2021 1429   ALT 78 (H) 04/30/2021 1429   ALKPHOS 88 04/30/2021 1429   BILITOT 1.2 04/30/2021 1429   GFRNONAA >60 04/30/2021 1429   GFRAA 102 12/19/2018 1048   Lab Results  Component Value Date   WBC 5.8 04/30/2021   NEUTROABS 4.3 04/30/2021   HGB 16.4 04/30/2021   HCT 47.1 04/30/2021   MCV 93.3 04/30/2021   PLT 281 04/30/2021   Imaging:  [facility] Clinician Interpretation: I have personally reviewed the CNS images as listed.  My interpretation, in the context of the patient's clinical presentation, is stable disease  MR BRAIN W WO CONTRAST  Result Date: 06/06/2021 CLINICAL DATA:  Seen S neoplasm assess treatment response. History of anaplastic oligodendroglioma with resection, radiation, and chemotherapy EXAM: MRI HEAD WITHOUT AND WITH CONTRAST TECHNIQUE: Multiplanar, multiecho pulse sequences of the brain and surrounding structures were obtained without and with intravenous contrast. CONTRAST:  1m MULTIHANCE GADOBENATE DIMEGLUMINE 529 MG/ML IV SOLN COMPARISON:  02/13/2021 FINDINGS: Brain: High left parietal resection with decreased postoperative swelling and blood products with partially collapsed cavity and decreased surrounding T2 hyperintensity. The remaining T2 signal is fairly localized in the adjacent [place] matter and paramediastinal cortex with indistinct/infiltrative type appearance. Clustered nodular enhancement is present along the lower margin of the cavity and vague parenchymal enhancement is seen at the anterior inferior margin of the cavity. Since initial study, there is an ovoid area of fairly restricted diffusion anterior to the epicenter of mass. No complicating infarct, hemorrhage, hydrocephalus, or collection. No new areas of signal abnormality in the brain Vascular: Normal flow voids, including the superior sagittal sinus near the resection Skull and upper cervical spine: Unremarkable craniotomy Sinuses/Orbits: Negative IMPRESSION: Left parietal oligodendroglioma with  interval resolution of postoperative findings. No progression of T2 signal. There are small areas of enhancement which could have been obscured by postoperative enhancement seen previously. Small areas of diffusion hyperintensity at the anterior lesion or unchanged since initial imaging and not associated with enhancement. Electronically Signed   By: JJorje GuildM.D.   On: 06/06/2021 06:51     Assessment/Plan Anaplastic oligodendroglioma, IDH mutant and 1p/19q-codeleted ([place]  JSHAYDON LEASEis clinically stable today, now having complete IMRT and Temozolomide.  MRI demonstrates overall stable findings, with foci of enhancement adjacent to resection cavity reflective of post-RT change, most likely.  We recommended initiating treatment with Temozolomide 1524mm2, on  for five days and off for twenty three days in twenty eight day cycles. The patient will have a complete blood count performed on days 21 and 28 of each cycle, and a comprehensive metabolic panel performed on day 28 of each cycle. Labs may need to be performed more often. Zofran will prescribed for home use for nausea/vomiting.   Informed consent was obtained verbally at bedside to proceed with oral chemotherapy.  Chemotherapy should be held for the following:  ANC less than 1,000  Platelets less than 100,000  LFT or creatinine greater than 2x ULN  If clinical concerns/contraindications develop  Keppra should con't 219m BID.  If irritability persists can consider switch to lamictal.    We ask that JMISHAWN HEMANNreturn to clinic in 1 months with labs for evaluation prior to cycle #2.  All questions were answered. The patient knows to call the clinic with any problems, questions or concerns. No barriers to learning were detected.  The total time spent in the encounter was 40 minutes and more than 50% was on counseling and review of test results   ZVentura Sellers MD Medical Director of Neuro-Oncology [facility] [place]12/05/22 12:33 PM

## 2021-06-09 ENCOUNTER — Other Ambulatory Visit (HOSPITAL_COMMUNITY): Payer: Self-pay

## 2021-06-09 NOTE — Telephone Encounter (Signed)
Oral Chemotherapy Pharmacist Encounter  I spoke with patient for overview of: Temodar (temozolomide) for the maintenance treatment of anaplastic oligodendroglioma, planned duration 6-12 months of treatment.  Counseled on administration, dosing, side effects, monitoring, drug-food interactions, safe handling, storage, and disposal.  Patient will take Temodar 180mg  capsules, 2 capsules, 360mg  total daily dose, by mouth once daily, may take at bedtime and on an empty stomach to decrease nausea and vomiting.  If 1st cycle is well tolerated, patient informed that Temodar dose may be increased to 200 mg/m2 daily for 5 days on, 23 days off, repeated every 28 days for subsequent cycles   Patient will take Temodar daily for 5 days on, 23 days off, and repeated.  Temodar start date: 06/10/21 PM   Patient will take Zofran 8mg  tablet, 1 tablet by mouth 30-60 min prior to Temodar dose to help decrease N/V.   Adverse effects include but are not limited to: nausea, vomiting, anorexia, GI upset, rash, drug fever, and fatigue. Rare but serious adverse effects of pneumocystis pneumonia and secondary malignancy also discussed.  We discussed strategies to manage constipation if they occur secondary to ondansetron dosing.  PCP prophylaxis will not be initiated at this time, but may be added based on lymphocyte count in the future.  Reviewed importance of keeping a medication schedule and plan for any missed doses. No barriers to medication adherence identified.  Medication reconciliation performed and medication/allergy list updated.  Insurance authorization for Temodar has been obtained. Test claim at the pharmacy revealed copayment $0 for 1st fill of Temodar. This will ship from the [facility] on 06/09/21 to deliver to patient's home on 06/10/21.  Patient informed the pharmacy will reach out 5-7 days prior to needing next fill of Temodar to coordinate continued medication acquisition to  prevent break in therapy.  All questions answered.  Mr. Utsey voiced understanding and appreciation.   Medication education handout placed in mail for patient. Patient knows to call the office with questions or concerns. Oral Chemotherapy Clinic phone number provided to patient.   Leron Croak, PharmD, BCPS Hematology/Oncology Clinical Pharmacist [facility] [phone] 06/09/2021 3:42 PM

## 2021-06-10 ENCOUNTER — Telehealth: Payer: Self-pay

## 2021-06-10 NOTE — Telephone Encounter (Signed)
Pt left a message requesting a return call indicating he has some questions before his follow-up appointment in January 2023.  I have called the pt back and LM advising I was returning his call.

## 2021-06-12 ENCOUNTER — Encounter: Payer: Self-pay | Admitting: Radiation Oncology

## 2021-06-12 NOTE — Progress Notes (Signed)
Radiation Oncology         [phone] ________________________________  Name: Edwin Moreno MRN: [id number]  Date: 06/08/2021  DOB: Nov 07, 1975  Follow-Up Visit Note  Outpatient  CC: [name], MD  [name], MD  Diagnosis and Prior Radiotherapy:    ICD-10-CM   1. Cancer of parietal lobe (HCC)  C71.3      Mass of parietal lobe: WHO 3 oligodendroglioma, IDH-mt   CHIEF COMPLAINT: Here for follow-up and surveillance of brain cancer  Narrative:  The patient returns today for routine follow-up.  [name] presents today for follow-up after completing radiation on 05/06/2021  Dose of Decadron, if applicable: Discontinued on his own about 10 days ago due to issues with over eating/not being able to sense when he's full (which would cause him to get nauseous/sick)  Recent neurologic symptoms, if any:  Seizures: Patient denies Headaches: Yes--reports constant mild head aches that are typically located on the left side of his head and radiate down the back of his neck Nausea: Patient denies Dizziness/ataxia: Reports occasionally feeling unsteady on his feet due to decreased sensation to his right foot. Denies any falls, but does report feeling unbalanced at times if terrain is uneven because he has difficulty judging given numbness to right foot Difficulty with hand coordination: Patient denies Focal numbness/weakness: Continued numbness/ decreased feeling to right foot/leg; Reports it seems to be receeding, and is no longer above the knee Visual deficits/changes: No new issues/concerns. Continued blurriness to right eye Confusion/Memory deficits: Patient denies  Additional Complaints / other details: Also had F/U with Dr. Mickeal Skinner today                               ALLERGIES:  has No Known Allergies.  Meds: Current Outpatient Medications  Medication Sig Dispense Refill   allopurinol (ZYLOPRIM) 300 MG tablet Take 1 tablet (300 mg total) by mouth daily. (Needs to be  seen before next refill) 30 tablet 0   Ascorbic Acid (VITAMIN C) 1000 MG tablet Take 1,000 mg by mouth daily. (Patient not taking: Reported on 04/16/2021)     cetirizine (ZYRTEC) 10 MG tablet Take 10 mg by mouth daily.     Cholecalciferol (VITAMIN D3) 50 MCG (2000 UT) TABS Take 2,000 Units by mouth daily.     dexamethasone (DECADRON) 2 MG tablet Take 1 tablet (2 mg total) by mouth daily. (Patient not taking: Reported on 06/08/2021) 60 tablet 1   Glucosamine HCl 1500 MG TABS Take 1,500 mg by mouth daily. (Patient not taking: Reported on 04/02/2021)     indomethacin (INDOCIN SR) 75 MG CR capsule Take 75 mg by mouth 2 (two) times daily with a meal.     levETIRAcetam (KEPPRA) 250 MG tablet Take 1 tablet (250 mg total) by mouth 2 (two) times daily. 60 tablet 3   LORazepam (ATIVAN) 0.5 MG tablet Take 1 tablet (0.5 mg total) by mouth daily as needed for anxiety (Radiation sedation/claustrophobia). (Patient not taking: Reported on 06/08/2021) 40 tablet 0   Omega-3 Fatty Acids (FISH OIL) 1200 MG CAPS Take 1,200 mg by mouth daily.     omeprazole (PRILOSEC) 40 MG capsule TAKE (1) CAPSULE DAILY (Needs to be seen before next refill) (Patient taking differently: Take 40 mg by mouth daily.) 30 capsule 0   ondansetron (ZOFRAN) 8 MG tablet Take 1 tablet (8 mg total) by mouth 2 (two) times daily as needed (nausea and vomiting). May take  30-60 minutes prior to Temodar administration if nausea/vomiting occurs. 30 tablet 1   OVER THE COUNTER MEDICATION Take 1 tablet by mouth daily. [place] (Patient not taking: Reported on 04/16/2021)     pravastatin (PRAVACHOL) 40 MG tablet TAKE (1/2) TABLET DAILY. (Needs to be seen before next refill) (Patient taking differently: Take 20 mg by mouth daily.) 90 tablet 1   temozolomide (TEMODAR) 180 MG capsule Take 2 capsules (360 mg total) by mouth daily for 5 days. Take on Days 1-5 of Cycle 1 of treatment. May take on an empty stomach to decrease nausea & vomiting. 10 capsule 0    zinc gluconate 50 MG tablet Take 50 mg by mouth daily.     No current facility-administered medications for this encounter.    Physical Findings: The patient is in no acute distress. Patient is alert and oriented.  vitals were not taken for this visit. Marland Kitchen    HEENT: No oral thrush. NEURO: No obvious focalities.  He is ambulatory and his strength is intact bilaterally. MSK: Well-nourished, muscle tone intact  Lab Findings: Lab Results  Component Value Date   WBC 5.8 04/30/2021   HGB 16.4 04/30/2021   HCT 47.1 04/30/2021   MCV 93.3 04/30/2021   PLT 281 04/30/2021    Radiographic Findings: MR BRAIN W WO CONTRAST  Result Date: 06/06/2021 CLINICAL DATA:  Seen S neoplasm assess treatment response. History of anaplastic oligodendroglioma with resection, radiation, and chemotherapy EXAM: MRI HEAD WITHOUT AND WITH CONTRAST TECHNIQUE: Multiplanar, multiecho pulse sequences of the brain and surrounding structures were obtained without and with intravenous contrast. CONTRAST:  88m MULTIHANCE GADOBENATE DIMEGLUMINE 529 MG/ML IV SOLN COMPARISON:  02/13/2021 FINDINGS: Brain: High left parietal resection with decreased postoperative swelling and blood products with partially collapsed cavity and decreased surrounding T2 hyperintensity. The remaining T2 signal is fairly localized in the adjacent [place] matter and paramediastinal cortex with indistinct/infiltrative type appearance. Clustered nodular enhancement is present along the lower margin of the cavity and vague parenchymal enhancement is seen at the anterior inferior margin of the cavity. Since initial study, there is an ovoid area of fairly restricted diffusion anterior to the epicenter of mass. No complicating infarct, hemorrhage, hydrocephalus, or collection. No new areas of signal abnormality in the brain Vascular: Normal flow voids, including the superior sagittal sinus near the resection Skull and upper cervical spine: Unremarkable craniotomy  Sinuses/Orbits: Negative IMPRESSION: Left parietal oligodendroglioma with interval resolution of postoperative findings. No progression of T2 signal. There are small areas of enhancement which could have been obscured by postoperative enhancement seen previously. Small areas of diffusion hyperintensity at the anterior lesion or unchanged since initial imaging and not associated with enhancement. Electronically Signed   By: JJorje GuildM.D.   On: 06/06/2021 06:51    Impression/Plan:   He is recovering well from chemoradiation.  He will continue to follow-up with medical oncology.  I will see him back on an as-needed basis.  He is pleased with this plan.  I wished him the very best.  On date of service, in total, I spent 15 minutes on this encounter. Patient was seen in person.  _____________________________________   SEppie Gibson MD

## 2021-06-25 ENCOUNTER — Encounter: Payer: Self-pay | Admitting: Internal Medicine

## 2021-06-25 NOTE — Progress Notes (Signed)
° °                                                                                                                                                          °  Patient Name: Edwin Moreno MRN: [id number] DOB: [birth year] Referring Physician: Cecil Cobbs Date of Service: 05/06/2021 [facility], [place]                                                        End Of Treatment Note  Diagnoses: C71.3-Malignant neoplasm of parietal lobe  Intent: Curative  Radiation Treatment Dates: 03/23/2021 through 05/06/2021 Site Technique Total Dose (Gy) Dose per Fx (Gy) Completed Fx Beam Energies  Parietal Lobe: Brain_L_FrPar IMRT 45/45 1.8 25/25 6X  Parietal Lobe: Brain_Bst_L_FrPar IMRT 14.4/14.4 1.8 8/8 6X   Narrative: The patient tolerated radiation therapy relatively well.   Plan: The patient will follow-up with radiation oncology in 31mo.  -----------------------------------  Eppie Gibson, MD

## 2021-07-02 ENCOUNTER — Other Ambulatory Visit: Payer: Self-pay | Admitting: Internal Medicine

## 2021-07-02 ENCOUNTER — Other Ambulatory Visit (HOSPITAL_COMMUNITY): Payer: Self-pay

## 2021-07-02 DIAGNOSIS — C719 Malignant neoplasm of brain, unspecified: Secondary | ICD-10-CM

## 2021-07-06 ENCOUNTER — Other Ambulatory Visit (HOSPITAL_COMMUNITY): Payer: Self-pay

## 2021-07-14 ENCOUNTER — Inpatient Hospital Stay (HOSPITAL_BASED_OUTPATIENT_CLINIC_OR_DEPARTMENT_OTHER): Payer: BC Managed Care – PPO | Admitting: Internal Medicine

## 2021-07-14 ENCOUNTER — Other Ambulatory Visit (HOSPITAL_COMMUNITY): Payer: Self-pay

## 2021-07-14 ENCOUNTER — Other Ambulatory Visit: Payer: Self-pay

## 2021-07-14 ENCOUNTER — Inpatient Hospital Stay: Payer: BC Managed Care – PPO | Attending: Internal Medicine

## 2021-07-14 VITALS — BP 137/89 | HR 62 | Temp 97.9°F | Resp 16 | Ht 75.0 in | Wt 258.5 lb

## 2021-07-14 DIAGNOSIS — C719 Malignant neoplasm of brain, unspecified: Secondary | ICD-10-CM

## 2021-07-14 DIAGNOSIS — R42 Dizziness and giddiness: Secondary | ICD-10-CM | POA: Diagnosis not present

## 2021-07-14 DIAGNOSIS — Z923 Personal history of irradiation: Secondary | ICD-10-CM | POA: Insufficient documentation

## 2021-07-14 DIAGNOSIS — R112 Nausea with vomiting, unspecified: Secondary | ICD-10-CM | POA: Diagnosis not present

## 2021-07-14 DIAGNOSIS — C711 Malignant neoplasm of frontal lobe: Secondary | ICD-10-CM | POA: Diagnosis present

## 2021-07-14 DIAGNOSIS — K5900 Constipation, unspecified: Secondary | ICD-10-CM | POA: Insufficient documentation

## 2021-07-14 DIAGNOSIS — R519 Headache, unspecified: Secondary | ICD-10-CM | POA: Diagnosis not present

## 2021-07-14 DIAGNOSIS — Z79899 Other long term (current) drug therapy: Secondary | ICD-10-CM | POA: Insufficient documentation

## 2021-07-14 LAB — CBC WITH DIFFERENTIAL (CANCER CENTER ONLY)
Abs Immature Granulocytes: 0.02 10*3/uL (ref 0.00–0.07)
Basophils Absolute: 0 10*3/uL (ref 0.0–0.1)
Basophils Relative: 1 %
Eosinophils Absolute: 0.1 10*3/uL (ref 0.0–0.5)
Eosinophils Relative: 2 %
HCT: 46 % (ref 39.0–52.0)
Hemoglobin: 16.3 g/dL (ref 13.0–17.0)
Immature Granulocytes: 0 %
Lymphocytes Relative: 28 %
Lymphs Abs: 1.3 10*3/uL (ref 0.7–4.0)
MCH: 32.2 pg (ref 26.0–34.0)
MCHC: 35.4 g/dL (ref 30.0–36.0)
MCV: 90.9 fL (ref 80.0–100.0)
Monocytes Absolute: 0.3 10*3/uL (ref 0.1–1.0)
Monocytes Relative: 7 %
Neutro Abs: 3 10*3/uL (ref 1.7–7.7)
Neutrophils Relative %: 62 %
Platelet Count: 234 10*3/uL (ref 150–400)
RBC: 5.06 MIL/uL (ref 4.22–5.81)
RDW: 11.9 % (ref 11.5–15.5)
WBC Count: 4.7 10*3/uL (ref 4.0–10.5)
nRBC: 0 % (ref 0.0–0.2)

## 2021-07-14 LAB — CMP (CANCER CENTER ONLY)
ALT: 28 U/L (ref 0–44)
AST: 18 U/L (ref 15–41)
Albumin: 4.8 g/dL (ref 3.5–5.0)
Alkaline Phosphatase: 75 U/L (ref 38–126)
Anion gap: 7 (ref 5–15)
BUN: 23 mg/dL — ABNORMAL HIGH (ref 6–20)
CO2: 29 mmol/L (ref 22–32)
Calcium: 9.8 mg/dL (ref 8.9–10.3)
Chloride: 104 mmol/L (ref 98–111)
Creatinine: 1.06 mg/dL (ref 0.61–1.24)
GFR, Estimated: >60 mL/min (ref >60)
Glucose, Bld: 88 mg/dL (ref 70–99)
Potassium: 4.2 mmol/L (ref 3.5–5.1)
Sodium: 140 mmol/L (ref 135–145)
Total Bilirubin: 1 mg/dL (ref 0.3–1.2)
Total Protein: 7.4 g/dL (ref 6.5–8.1)

## 2021-07-14 MED ORDER — LORAZEPAM 2 MG PO TABS: 2.0000 mg | ORAL_TABLET | Freq: Four times a day (QID) | ORAL | 0 refills | Status: DC | PRN

## 2021-07-14 MED ORDER — LAMOTRIGINE 25 MG PO TABS: ORAL_TABLET | ORAL | 0 refills | Status: DC

## 2021-07-14 MED ORDER — LAMOTRIGINE 100 MG PO TABS: 100.0000 mg | ORAL_TABLET | Freq: Two times a day (BID) | ORAL | 3 refills | Status: DC

## 2021-07-14 MED ORDER — TEMOZOLOMIDE 100 MG PO CAPS
200.0000 mg/m2/d | ORAL_CAPSULE | Freq: Every day | ORAL | 0 refills | Status: AC
  Filled 2021-07-14: qty 25, 28d supply, fill #0

## 2021-07-14 MED ORDER — PROCHLORPERAZINE MALEATE 10 MG PO TABS: 10.0000 mg | ORAL_TABLET | Freq: Four times a day (QID) | ORAL | 1 refills | Status: AC | PRN

## 2021-07-14 NOTE — Progress Notes (Signed)
[facility] at [place], [place] [address] [phone]   Interval Evaluation  Date of Service: 07/14/21 Patient Name: Edwin Moreno Patient MRN: [id number] Patient DOB: 1976/04/24 Provider: [name], MD  Identifying Statement:  Edwin Moreno is a 46 y.o. 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 Zada Finders; path is WHO 3 oligodendroglioma, IDH-mt   03/23/2021 - 05/06/2021 Radiation Therapy   IMRT and daily Temodar 86m/m2 (Isidore Moos   06/14/2021 -  Chemotherapy   Patient is on Treatment Plan : BRAIN ANAPLASTIC GLIOMA GRADE III Temozolomide Post XRT q28d       Biomarkers:  MGMT Unknown.  IDH 1/2 Mutated.  1p/19q codeleted  TERT Unknown   Interval History: Edwin TOMASpresents today for follow up, now having completed 1st cycle of Temodar.  Had some difficulty with nausea the week of treatment and several days after.  Zofran leads to some constipation and headaches. No recent changes with regards to right leg numbness.  Does describe two seizure episodes this month, same semiology, self limited.  Continues on Keppra 2551mtwice per day, no decadron.    H+P (03/02/21) Patient presented to medical attention this July with complaint of several years history of dizziness.  He described episodes of dizziness which would last for seconds to a minute, would come on "at any time".  They did/do not lead to any impairment or dyfunction.  Overall frequency had increased over the past months, to greater than once per week.  CNS imaging demonstrated an enhancing mass within the left frontal lobe, consistent with primary brain tumor.  He  underwent craniotomy and resection with Dr. OsZada Findersn 02/13/21; path demonstrates high grade glioma with IDH mutation.  Following surgery, he has not complained of dizzy episodes, or any neurologic issues.  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 (Needs to be seen before next refill) 30 tablet 0   Ascorbic Acid (VITAMIN C) 1000 MG tablet Take 1,000 mg by mouth daily. (Patient not taking: Reported on 04/16/2021)     cetirizine (ZYRTEC) 10 MG tablet Take 10 mg by mouth daily.     Cholecalciferol (VITAMIN D3) 50 MCG (2000 UT) TABS Take 2,000 Units by mouth daily.     dexamethasone (DECADRON) 2 MG tablet Take 1 tablet (2 mg total) by mouth daily. (Patient not taking: Reported on 06/08/2021) 60 tablet 1   Glucosamine HCl 1500 MG TABS Take 1,500 mg by mouth daily. (Patient not taking: Reported on 04/02/2021)     indomethacin (INDOCIN SR) 75 MG CR capsule Take 75 mg by mouth 2 (two) times daily with a meal.     levETIRAcetam (KEPPRA) 250 MG tablet Take 1 tablet (250 mg total) by mouth 2 (two) times daily. 60 tablet 3   LORazepam (ATIVAN) 0.5 MG tablet Take 1 tablet (0.5 mg total) by mouth daily as needed for anxiety (Radiation sedation/claustrophobia). (Patient not taking: Reported on 06/08/2021) 40 tablet 0   Omega-3 Fatty Acids (FISH  OIL) 1200 MG CAPS Take 1,200 mg by mouth daily.     omeprazole (PRILOSEC) 40 MG capsule TAKE (1) CAPSULE DAILY (Needs to be seen before next refill) (Patient taking differently: Take 40 mg by mouth daily.) 30 capsule 0   ondansetron (ZOFRAN) 8 MG tablet Take 1 tablet (8 mg total) by mouth 2 (two) times daily as needed (nausea and vomiting). May take 30-60 minutes prior to Temodar administration if nausea/vomiting occurs. 30 tablet 1   OVER THE COUNTER MEDICATION Take 1  tablet by mouth daily. [place] (Patient not taking: Reported on 04/16/2021)     pravastatin (PRAVACHOL) 40 MG tablet TAKE (1/2) TABLET DAILY. (Needs to be seen before next refill) (Patient taking differently: Take 20 mg by mouth daily.) 90 tablet 1   zinc gluconate 50 MG tablet Take 50 mg by mouth daily.     No current facility-administered medications on file prior to visit.    Allergies: No Known Allergies Past Medical History:  Past Medical History:  Diagnosis Date   Arthritis    Asthma    CHILDHOOD HX   GERD (gastroesophageal reflux disease)    Gout    H/O removal of cyst    hip as a child   Hyperlipidemia    Past Surgical History:  Past Surgical History:  Procedure Laterality Date   APPLICATION OF CRANIAL NAVIGATION N/A 02/13/2021   Procedure: APPLICATION OF CRANIAL NAVIGATION;  Surgeon: Judith Part, MD;  Location: [facility];  Service: Neurosurgery;  Laterality: N/A;  RM 19   CRANIOTOMY Left 02/13/2021   Procedure: Left Craniotomy for tumor resection with brainlab;  Surgeon: Judith Part, MD;  Location: [facility];  Service: Neurosurgery;  Laterality: Left;   Social History:  Social History   Socioeconomic History   Marital status: Married    Spouse name: Not on file   Number of children: Not on file   Years of education: Not on file   Highest education level: Not on file  Occupational History   Not on file  Tobacco Use   Smoking status: Never   Smokeless tobacco: Former    Types: Nurse, children's Use: Never used  Substance and Sexual Activity   Alcohol use: Yes    Comment: 3-4 x weekly- beers   Drug use: No   Sexual activity: Yes  Other Topics Concern   Not on file  Social History Narrative   Not on file   Social Determinants of Health   Financial Resource Strain: Not on file  Food Insecurity: Not on file  Transportation Needs: Not on file  Physical Activity: Not on file  Stress: Not on file  Social Connections: Not on file   Intimate Partner Violence: Not on file   Family History: No family history on file.  Review of Systems: Constitutional: Doesn't report fevers, chills or abnormal weight loss Eyes: Doesn't report blurriness of vision Ears, nose, mouth, throat, and face: Doesn't report sore throat Respiratory: Doesn't report cough, dyspnea or wheezes Cardiovascular: Doesn't report palpitation, chest discomfort  Gastrointestinal:  Doesn't report nausea, constipation, diarrhea GU: Doesn't report incontinence Skin: Doesn't report skin rashes Neurological: Per HPI Musculoskeletal: Doesn't report joint pain Behavioral/Psych: Doesn't report anxiety  Physical Exam: Vitals:   07/14/21 1015  BP: 137/89  Pulse: 62  Resp: 16  Temp: 97.9 F (36.6 C)  SpO2: 100%    KPS: 90. General: Alert, cooperative, pleasant, in no acute distress Head: Normal EENT: No conjunctival  injection or scleral icterus.  Lungs: Resp effort normal Cardiac: Regular rate Abdomen: Non-distended abdomen Skin: No rashes cyanosis or petechiae. Extremities: No clubbing or edema  Neurologic Exam: Mental Status: Awake, alert, attentive to examiner. Oriented to self and environment. Language is fluent with intact comprehension.  Cranial Nerves: Visual acuity is grossly normal. Visual fields are full. Extra-ocular movements intact. No ptosis. Face is symmetric Motor: Tone and bulk are normal. Power is full in both arms and legs. Reflexes are symmetric, no pathologic reflexes present.  Sensory: Intact to light touch Gait: Normal.   Labs: I have reviewed the data as listed    Component Value Date/Time   NA 142 04/30/2021 1429   NA 141 12/19/2018 1048   K 4.3 04/30/2021 1429   CL 106 04/30/2021 1429   CO2 24 04/30/2021 1429   GLUCOSE 98 04/30/2021 1429   BUN 14 04/30/2021 1429   BUN 15 12/19/2018 1048   CREATININE 1.14 04/30/2021 1429   CALCIUM 9.7 04/30/2021 1429   PROT 7.9 04/30/2021 1429   PROT 6.9 12/19/2018 1048    ALBUMIN 4.7 04/30/2021 1429   ALBUMIN 5.0 12/19/2018 1048   AST 40 04/30/2021 1429   ALT 78 (H) 04/30/2021 1429   ALKPHOS 88 04/30/2021 1429   BILITOT 1.2 04/30/2021 1429   GFRNONAA >60 04/30/2021 1429   GFRAA 102 12/19/2018 1048   Lab Results  Component Value Date   WBC 4.7 07/14/2021   NEUTROABS 3.0 07/14/2021   HGB 16.3 07/14/2021   HCT 46.0 07/14/2021   MCV 90.9 07/14/2021   PLT 234 07/14/2021     Assessment/Plan Anaplastic oligodendroglioma, IDH mutant and 1p/19q-codeleted ([place])  Edwin Moreno is clinically stable today, now having completed cycle #1 of 5-day Temodar.    We recommended continuing treatment with cycle #2 Temozolomide, dose increased to 252m/m2, on for five days and off for twenty three days in twenty eight day cycles. The patient will have a complete blood count performed on days 21 and 28 of each cycle, and a comprehensive metabolic panel performed on day 28 of each cycle. Labs may need to be performed more often. Zofran will prescribed for home use for nausea/vomiting.   For refractory nausea, poor tolerance of zofran, will additionally provide script for compazine 159mq6 PRN.  Chemotherapy should be held for the following:  ANC less than 1,000  Platelets less than 100,000  LFT or creatinine greater than 2x ULN  If clinical concerns/contraindications develop  For seizures, continues to have irritability issues with Keppra, though notably improved with the lower dose.  We will recommend transition to Lamictal as follows: -Take Lamictal 5079mn AM x7 days -Then take 100m35m AM x7 days -Then take 100mg62m, after 7 days STOP Keppra if no further seizures  He understands to call us ifKoreae develops a rash on Lamictal.  Ativan 2mg P6m6 PRN will also be provided for prolonged or clustered breakthrough seizures.   We ask that Edwin WOODARD PERRELLn to clinic in 1 months with labs for evaluation prior to cycle #3.  All questions were answered. The  patient knows to call the clinic with any problems, questions or concerns. No barriers to learning were detected.  The total time spent in the encounter was 40 minutes and more than 50% was on counseling and review of test results   ZacharVentura Sellersedical Director of Neuro-Oncology [facility]/23 10:41 AM

## 2021-07-15 ENCOUNTER — Other Ambulatory Visit (HOSPITAL_COMMUNITY): Payer: Self-pay

## 2021-07-15 ENCOUNTER — Telehealth: Payer: Self-pay | Admitting: Internal Medicine

## 2021-07-15 NOTE — Telephone Encounter (Signed)
Scheduled follow-up appointment per 1/10 los. Patient is aware. °

## 2021-07-23 ENCOUNTER — Other Ambulatory Visit (HOSPITAL_COMMUNITY): Payer: Self-pay

## 2021-07-29 ENCOUNTER — Telehealth: Payer: Self-pay

## 2021-07-29 NOTE — Telephone Encounter (Signed)
Pt called to ask if his disability paperwork had been completed. Paperwork received by HIM on 1/16. As of yet not completed. Shaarav had questions for [employer] about second set of paperwork that needs to be completed. Patient aware that he will receive call back.

## 2021-08-03 ENCOUNTER — Other Ambulatory Visit (HOSPITAL_COMMUNITY): Payer: Self-pay

## 2021-08-05 ENCOUNTER — Telehealth: Payer: Self-pay

## 2021-08-05 NOTE — Telephone Encounter (Signed)
Notified Patient of completion of Form 7A Disability Eligibility Review. Copy of form e-mailed to patient as requested. Signed Release of Information Form forwarded to [facility] Management Department for requested medical records. No further needs verbalized at this time.

## 2021-08-17 ENCOUNTER — Inpatient Hospital Stay: Payer: BC Managed Care – PPO | Attending: Internal Medicine | Admitting: Internal Medicine

## 2021-08-17 ENCOUNTER — Inpatient Hospital Stay: Payer: BC Managed Care – PPO

## 2021-08-17 ENCOUNTER — Other Ambulatory Visit: Payer: Self-pay

## 2021-08-17 ENCOUNTER — Other Ambulatory Visit (HOSPITAL_COMMUNITY): Payer: Self-pay

## 2021-08-17 VITALS — BP 130/81 | HR 63 | Temp 97.3°F | Resp 16 | Wt 260.9 lb

## 2021-08-17 DIAGNOSIS — Z79899 Other long term (current) drug therapy: Secondary | ICD-10-CM | POA: Insufficient documentation

## 2021-08-17 DIAGNOSIS — Z87891 Personal history of nicotine dependence: Secondary | ICD-10-CM | POA: Diagnosis not present

## 2021-08-17 DIAGNOSIS — R42 Dizziness and giddiness: Secondary | ICD-10-CM | POA: Insufficient documentation

## 2021-08-17 DIAGNOSIS — C719 Malignant neoplasm of brain, unspecified: Secondary | ICD-10-CM | POA: Diagnosis not present

## 2021-08-17 DIAGNOSIS — R112 Nausea with vomiting, unspecified: Secondary | ICD-10-CM | POA: Diagnosis not present

## 2021-08-17 DIAGNOSIS — C711 Malignant neoplasm of frontal lobe: Secondary | ICD-10-CM | POA: Insufficient documentation

## 2021-08-17 LAB — CBC WITH DIFFERENTIAL (CANCER CENTER ONLY)
Abs Immature Granulocytes: 0.06 10*3/uL (ref 0.00–0.07)
Basophils Absolute: 0.1 10*3/uL (ref 0.0–0.1)
Basophils Relative: 1 %
Eosinophils Absolute: 0.1 10*3/uL (ref 0.0–0.5)
Eosinophils Relative: 2 %
HCT: 42.3 % (ref 39.0–52.0)
Hemoglobin: 14.7 g/dL (ref 13.0–17.0)
Immature Granulocytes: 1 %
Lymphocytes Relative: 26 %
Lymphs Abs: 1.4 10*3/uL (ref 0.7–4.0)
MCH: 32.5 pg (ref 26.0–34.0)
MCHC: 34.8 g/dL (ref 30.0–36.0)
MCV: 93.6 fL (ref 80.0–100.0)
Monocytes Absolute: 0.4 10*3/uL (ref 0.1–1.0)
Monocytes Relative: 7 %
Neutro Abs: 3.4 10*3/uL (ref 1.7–7.7)
Neutrophils Relative %: 63 %
Platelet Count: 219 10*3/uL (ref 150–400)
RBC: 4.52 MIL/uL (ref 4.22–5.81)
RDW: 12.6 % (ref 11.5–15.5)
WBC Count: 5.4 10*3/uL (ref 4.0–10.5)
nRBC: 0 % (ref 0.0–0.2)

## 2021-08-17 LAB — CMP (CANCER CENTER ONLY)
ALT: 19 U/L (ref 0–44)
AST: 13 U/L — ABNORMAL LOW (ref 15–41)
Albumin: 4.5 g/dL (ref 3.5–5.0)
Alkaline Phosphatase: 64 U/L (ref 38–126)
Anion gap: 5 (ref 5–15)
BUN: 25 mg/dL — ABNORMAL HIGH (ref 6–20)
CO2: 31 mmol/L (ref 22–32)
Calcium: 9.6 mg/dL (ref 8.9–10.3)
Chloride: 104 mmol/L (ref 98–111)
Creatinine: 1.08 mg/dL (ref 0.61–1.24)
GFR, Estimated: >60 mL/min (ref >60)
Glucose, Bld: 88 mg/dL (ref 70–99)
Potassium: 4.2 mmol/L (ref 3.5–5.1)
Sodium: 140 mmol/L (ref 135–145)
Total Bilirubin: 0.6 mg/dL (ref 0.3–1.2)
Total Protein: 6.8 g/dL (ref 6.5–8.1)

## 2021-08-17 MED ORDER — TEMOZOLOMIDE 100 MG PO CAPS
200.0000 mg/m2/d | ORAL_CAPSULE | Freq: Every day | ORAL | 0 refills | Status: AC
  Filled 2021-08-17 – 2021-08-18 (×2): qty 25, 5d supply, fill #0

## 2021-08-17 NOTE — Progress Notes (Signed)
Edwin Moreno, [place] [address] [phone]   Interval Evaluation  Date of Service: 08/17/21 Patient Name: Edwin Moreno Patient MRN: [id number] Patient DOB: Dec 18, 1975 Provider: [name], MD  Identifying Statement:  Edwin Moreno is a 46 y.o. 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 Zada Finders; path is WHO 3 oligodendroglioma, IDH-mt   03/23/2021 - 05/06/2021 Radiation Therapy   IMRT and daily Temodar 91m/m2 (Isidore Moos   06/14/2021 -  Chemotherapy   Patient is on Treatment Plan : BRAIN ANAPLASTIC GLIOMA GRADE III Temozolomide Post XRT q28d       Biomarkers:  MGMT Unknown.  IDH 1/2 Mutated.  1p/19q codeleted  TERT Unknown   Interval History: Edwin INESpresents today for follow up, now having completed 2nd cycle of Temodar.  He continues to have issues with nausea despite dosing both zofran and compazine this month.  He also describes several small seizure episodes since stopping the Keppra, maintains Lacm[place] at 1070mBID.  He does note significant reduction in irritability and anger episodes, however.  No recent changes with regards to right leg numbness. Decadron is back at 40m34maily.    H+P (03/02/21) Patient presented to medical attention this July with complaint of several years history of dizziness.  He described episodes of dizziness which would last for seconds to a minute, would come on "at any time".  They did/do not lead to any impairment or dyfunction.  Overall frequency had increased over the past months, to greater than once per week.  CNS imaging demonstrated an enhancing mass within the left  frontal lobe, consistent with primary brain tumor.  He underwent craniotomy and resection with Dr. OstZada Finders 02/13/21; path demonstrates high grade glioma with IDH mutation.  Following surgery, he has not complained of dizzy episodes, or any neurologic issues.  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 (Needs to be seen before next refill) 30 tablet 0   cetirizine (ZYRTEC) 10 MG tablet Take 10 mg by mouth daily.     Cholecalciferol (VITAMIN D3) 50 MCG (2000 UT) TABS Take 2,000 Units by mouth daily.     dexamethasone (DECADRON) 2 MG tablet Take 1 tablet (2 mg total) by mouth daily. 60 tablet 1   Glucosamine HCl 1500 MG TABS Take 1,500 mg by mouth daily.     indomethacin (INDOCIN SR) 75 MG CR capsule Take 75 mg by mouth 2 (two) times daily with a meal.     lamoTRIgine (LAMICTAL) 100 MG tablet Take 1 tablet (100 mg total) by mouth 2 (two) times daily. 60 tablet 3   LORazepam (ATIVAN) 2 MG tablet Take 1 tablet (2 mg total) by mouth every 6 (six) hours as needed for seizure. 20 tablet 0   Omega-3 Fatty Acids (FISH OIL) 1200 MG CAPS Take 1,200 mg by mouth daily.     omeprazole (PRILOSEC) 40 MG capsule TAKE (1) CAPSULE DAILY (Needs to be seen before next refill) (Patient  taking differently: Take 40 mg by mouth daily.) 30 capsule 0   ondansetron (ZOFRAN) 8 MG tablet Take 1 tablet (8 mg total) by mouth 2 (two) times daily as needed (nausea and vomiting). May take 30-60 minutes prior to Temodar administration if nausea/vomiting occurs. 30 tablet 1   OVER THE COUNTER MEDICATION Take 1 tablet by mouth daily. [place]     pravastatin (PRAVACHOL) 40 MG tablet TAKE (1/2) TABLET DAILY. (Needs to be seen before next refill) (Patient taking differently: Take 20 mg by mouth daily.) 90 tablet 1    prochlorperazine (COMPAZINE) 10 MG tablet Take 1 tablet (10 mg total) by mouth every 6 (six) hours as needed for nausea or vomiting. 30 tablet 1   zinc gluconate 50 MG tablet Take 50 mg by mouth daily.     Ascorbic Acid (VITAMIN C) 1000 MG tablet Take 1,000 mg by mouth daily. (Patient not taking: Reported on 04/16/2021)     LORazepam (ATIVAN) 0.5 MG tablet Take 1 tablet (0.5 mg total) by mouth daily as needed for anxiety (Radiation sedation/claustrophobia). (Patient not taking: Reported on 06/08/2021) 40 tablet 0   No current facility-administered medications on file prior to visit.    Allergies: No Known Allergies Past Medical History:  Past Medical History:  Diagnosis Date   Arthritis    Asthma    CHILDHOOD HX   GERD (gastroesophageal reflux disease)    Gout    H/O removal of cyst    hip as a child   Hyperlipidemia    Past Surgical History:  Past Surgical History:  Procedure Laterality Date   APPLICATION OF CRANIAL NAVIGATION N/A 02/13/2021   Procedure: APPLICATION OF CRANIAL NAVIGATION;  Surgeon: Judith Part, MD;  Location: [facility];  Service: Neurosurgery;  Laterality: N/A;  RM 19   CRANIOTOMY Left 02/13/2021   Procedure: Left Craniotomy for tumor resection with brainlab;  Surgeon: Judith Part, MD;  Location: [facility];  Service: Neurosurgery;  Laterality: Left;   Social History:  Social History   Socioeconomic History   Marital status: Married    Spouse name: Not on file   Number of children: Not on file   Years of education: Not on file   Highest education level: Not on file  Occupational History   Not on file  Tobacco Use   Smoking status: Never   Smokeless tobacco: Former    Types: Nurse, children's Use: Never used  Substance and Sexual Activity   Alcohol use: Yes    Comment: 3-4 x weekly- beers   Drug use: No   Sexual activity: Yes  Other Topics Concern   Not on file  Social History Narrative   Not on file   Social Determinants of Health    Financial Resource Strain: Not on file  Food Insecurity: Not on file  Transportation Needs: Not on file  Physical Activity: Not on file  Stress: Not on file  Social Connections: Not on file  Intimate Partner Violence: Not on file   Family History: No family history on file.  Review of Systems: Constitutional: Doesn't report fevers, chills or abnormal weight loss Eyes: Doesn't report blurriness of vision Ears, nose, mouth, throat, and face: Doesn't report sore throat Respiratory: Doesn't report cough, dyspnea or wheezes Cardiovascular: Doesn't report palpitation, chest discomfort  Gastrointestinal:  Doesn't report nausea, constipation, diarrhea GU: Doesn't report incontinence Skin: Doesn't report skin rashes Neurological: Per HPI Musculoskeletal: Doesn't report joint pain Behavioral/Psych: Doesn't report  anxiety  Physical Exam: Vitals:   08/17/21 1021  BP: 130/81  Pulse: 63  Resp: 16  Temp: (!) 97.3 F (36.3 C)  SpO2: 100%    KPS: 90. General: Alert, cooperative, pleasant, in no acute distress Head: Normal EENT: No conjunctival injection or scleral icterus.  Lungs: Resp effort normal Cardiac: Regular rate Abdomen: Non-distended abdomen Skin: No rashes cyanosis or petechiae. Extremities: No clubbing or edema  Neurologic Exam: Mental Status: Awake, alert, attentive to examiner. Oriented to self and environment. Language is fluent with intact comprehension.  Cranial Nerves: Visual acuity is grossly normal. Visual fields are full. Extra-ocular movements intact. No ptosis. Face is symmetric Motor: Tone and bulk are normal. Power is full in both arms and legs. Reflexes are symmetric, no pathologic reflexes present.  Sensory: Intact to light touch Gait: Normal.   Labs: I have reviewed the data as listed    Component Value Date/Time   NA 140 07/14/2021 1009   NA 141 12/19/2018 1048   K 4.2 07/14/2021 1009   CL 104 07/14/2021 1009   CO2 29 07/14/2021 1009    GLUCOSE 88 07/14/2021 1009   BUN 23 (H) 07/14/2021 1009   BUN 15 12/19/2018 1048   CREATININE 1.06 07/14/2021 1009   CALCIUM 9.8 07/14/2021 1009   PROT 7.4 07/14/2021 1009   PROT 6.9 12/19/2018 1048   ALBUMIN 4.8 07/14/2021 1009   ALBUMIN 5.0 12/19/2018 1048   AST 18 07/14/2021 1009   ALT 28 07/14/2021 1009   ALKPHOS 75 07/14/2021 1009   BILITOT 1.0 07/14/2021 1009   GFRNONAA >60 07/14/2021 1009   GFRAA 102 12/19/2018 1048   Lab Results  Component Value Date   WBC 5.4 08/17/2021   NEUTROABS 3.4 08/17/2021   HGB 14.7 08/17/2021   HCT 42.3 08/17/2021   MCV 93.6 08/17/2021   PLT 219 08/17/2021     Assessment/Plan Anaplastic oligodendroglioma, IDH mutant and 1p/19q-codeleted ([place])  Janthony Holleman Mcnab is clinically stable today, now having completed cycle #2 of 5-day Temodar.    We recommended continuing treatment with cycle #3 Temozolomide 270m/m2, on for five days and off for twenty three days in twenty eight day cycles. The patient will have a complete blood count performed on days 21 and 28 of each cycle, and a comprehensive metabolic panel performed on day 28 of each cycle. Labs may need to be performed more often. Zofran will prescribed for home use for nausea/vomiting.   For nausea, may continue zofran, compazine and also decadron now at 254mdaily.  He should half the decadron to 44m844mnce through the cycle.  Chemotherapy should be held for the following:  ANC less than 1,000  Platelets less than 100,000  LFT or creatinine greater than 2x ULN  If clinical concerns/contraindications develop  For breakthrough seizures, recommended increasing Lamictal to 150m32mD.  Mood symptoms have improved since Dc'ing Keppra.    Ativan 2mg 94mq6 PRN will also continue prolonged or clustered breakthrough seizures.   We ask that JasonGUSTAF MCCARTERrn to clinic in 1 months with MRI brain for evaluation prior to cycle #3.  All questions were answered. The patient knows to call the clinic  with any problems, questions or concerns. No barriers to learning were detected.  The total time spent in the encounter was 40 minutes and more than 50% was on counseling and review of test results   ZachaVentura SellersMedical Director of Neuro-Oncology [facility]le[facility]3/23 10:35

## 2021-08-18 ENCOUNTER — Other Ambulatory Visit (HOSPITAL_COMMUNITY): Payer: Self-pay

## 2021-08-18 ENCOUNTER — Other Ambulatory Visit: Payer: Self-pay | Admitting: Internal Medicine

## 2021-08-18 ENCOUNTER — Telehealth: Payer: Self-pay | Admitting: Internal Medicine

## 2021-08-18 DIAGNOSIS — C719 Malignant neoplasm of brain, unspecified: Secondary | ICD-10-CM

## 2021-08-18 MED ORDER — ONDANSETRON HCL 8 MG PO TABS: 8.0000 mg | ORAL_TABLET | Freq: Two times a day (BID) | ORAL | 1 refills | Status: DC | PRN

## 2021-08-18 MED ORDER — LAMOTRIGINE 150 MG PO TABS: 150.0000 mg | ORAL_TABLET | Freq: Two times a day (BID) | ORAL | 3 refills | Status: DC

## 2021-08-18 NOTE — Telephone Encounter (Signed)
Scheduled per 2/13 los, pt has been called and confirmed

## 2021-08-19 ENCOUNTER — Other Ambulatory Visit: Payer: Self-pay | Admitting: Internal Medicine

## 2021-08-19 DIAGNOSIS — C719 Malignant neoplasm of brain, unspecified: Secondary | ICD-10-CM

## 2021-08-20 ENCOUNTER — Encounter: Payer: Self-pay | Admitting: Internal Medicine

## 2021-09-01 ENCOUNTER — Other Ambulatory Visit: Payer: Self-pay | Admitting: Radiation Therapy

## 2021-09-08 ENCOUNTER — Other Ambulatory Visit (HOSPITAL_COMMUNITY): Payer: Self-pay

## 2021-09-08 ENCOUNTER — Other Ambulatory Visit: Payer: Self-pay | Admitting: Internal Medicine

## 2021-09-08 DIAGNOSIS — C719 Malignant neoplasm of brain, unspecified: Secondary | ICD-10-CM

## 2021-09-09 ENCOUNTER — Other Ambulatory Visit: Payer: Self-pay

## 2021-09-09 ENCOUNTER — Ambulatory Visit
Admission: RE | Admit: 2021-09-09 | Discharge: 2021-09-09 | Disposition: A | Discharge status: Home / Self Care | Payer: BC Managed Care – PPO | Source: Ambulatory Visit | Attending: Internal Medicine | Admitting: Internal Medicine

## 2021-09-09 DIAGNOSIS — C719 Malignant neoplasm of brain, unspecified: Secondary | ICD-10-CM

## 2021-09-09 MED ORDER — GADOBENATE DIMEGLUMINE 529 MG/ML IV SOLN
20.0000 mL | Freq: Once | INTRAVENOUS | Status: AC | PRN
  Administered 2021-09-09: 20 mL via INTRAVENOUS

## 2021-09-14 ENCOUNTER — Other Ambulatory Visit (HOSPITAL_COMMUNITY): Payer: Self-pay

## 2021-09-14 ENCOUNTER — Inpatient Hospital Stay (HOSPITAL_BASED_OUTPATIENT_CLINIC_OR_DEPARTMENT_OTHER): Payer: BC Managed Care – PPO | Admitting: Internal Medicine

## 2021-09-14 ENCOUNTER — Inpatient Hospital Stay: Payer: BC Managed Care – PPO | Attending: Internal Medicine

## 2021-09-14 ENCOUNTER — Other Ambulatory Visit: Payer: Self-pay

## 2021-09-14 ENCOUNTER — Inpatient Hospital Stay: Payer: BC Managed Care – PPO

## 2021-09-14 VITALS — BP 142/94 | HR 62 | Temp 97.4°F | Resp 16 | Ht 75.0 in | Wt 257.6 lb

## 2021-09-14 DIAGNOSIS — R112 Nausea with vomiting, unspecified: Secondary | ICD-10-CM | POA: Insufficient documentation

## 2021-09-14 DIAGNOSIS — C719 Malignant neoplasm of brain, unspecified: Secondary | ICD-10-CM

## 2021-09-14 DIAGNOSIS — C711 Malignant neoplasm of frontal lobe: Secondary | ICD-10-CM | POA: Insufficient documentation

## 2021-09-14 DIAGNOSIS — Z79899 Other long term (current) drug therapy: Secondary | ICD-10-CM | POA: Diagnosis not present

## 2021-09-14 DIAGNOSIS — Z87891 Personal history of nicotine dependence: Secondary | ICD-10-CM | POA: Insufficient documentation

## 2021-09-14 DIAGNOSIS — R42 Dizziness and giddiness: Secondary | ICD-10-CM | POA: Diagnosis not present

## 2021-09-14 DIAGNOSIS — R569 Unspecified convulsions: Secondary | ICD-10-CM | POA: Diagnosis not present

## 2021-09-14 LAB — CMP (CANCER CENTER ONLY)
ALT: 26 U/L (ref 0–44)
AST: 12 U/L — ABNORMAL LOW (ref 15–41)
Albumin: 4.7 g/dL (ref 3.5–5.0)
Alkaline Phosphatase: 63 U/L (ref 38–126)
Anion gap: 7 (ref 5–15)
BUN: 17 mg/dL (ref 6–20)
CO2: 30 mmol/L (ref 22–32)
Calcium: 10.3 mg/dL (ref 8.9–10.3)
Chloride: 104 mmol/L (ref 98–111)
Creatinine: 0.93 mg/dL (ref 0.61–1.24)
GFR, Estimated: >60 mL/min (ref >60)
Glucose, Bld: 121 mg/dL — ABNORMAL HIGH (ref 70–99)
Potassium: 4.2 mmol/L (ref 3.5–5.1)
Sodium: 141 mmol/L (ref 135–145)
Total Bilirubin: 0.7 mg/dL (ref 0.3–1.2)
Total Protein: 7.1 g/dL (ref 6.5–8.1)

## 2021-09-14 LAB — CBC WITH DIFFERENTIAL (CANCER CENTER ONLY)
Abs Immature Granulocytes: 0.13 10*3/uL — ABNORMAL HIGH (ref 0.00–0.07)
Basophils Absolute: 0 10*3/uL (ref 0.0–0.1)
Basophils Relative: 1 %
Eosinophils Absolute: 0 10*3/uL (ref 0.0–0.5)
Eosinophils Relative: 0 %
HCT: 45.4 % (ref 39.0–52.0)
Hemoglobin: 15.8 g/dL (ref 13.0–17.0)
Immature Granulocytes: 2 %
Lymphocytes Relative: 11 %
Lymphs Abs: 0.8 10*3/uL (ref 0.7–4.0)
MCH: 33.2 pg (ref 26.0–34.0)
MCHC: 34.8 g/dL (ref 30.0–36.0)
MCV: 95.4 fL (ref 80.0–100.0)
Monocytes Absolute: 0.3 10*3/uL (ref 0.1–1.0)
Monocytes Relative: 5 %
Neutro Abs: 6.1 10*3/uL (ref 1.7–7.7)
Neutrophils Relative %: 81 %
Platelet Count: 258 10*3/uL (ref 150–400)
RBC: 4.76 MIL/uL (ref 4.22–5.81)
RDW: 12.7 % (ref 11.5–15.5)
WBC Count: 7.4 10*3/uL (ref 4.0–10.5)
nRBC: 0 % (ref 0.0–0.2)

## 2021-09-14 MED ORDER — LORAZEPAM 2 MG PO TABS: ORAL_TABLET | ORAL | 0 refills | Status: DC

## 2021-09-14 MED ORDER — LAMOTRIGINE 200 MG PO TABS
200.0000 mg | ORAL_TABLET | Freq: Two times a day (BID) | ORAL | 3 refills | Status: DC
Start: 2021-09-14 — End: 2021-12-14

## 2021-09-14 MED ORDER — TEMOZOLOMIDE 100 MG PO CAPS
200.0000 mg/m2/d | ORAL_CAPSULE | Freq: Every day | ORAL | 0 refills | Status: AC
  Filled 2021-09-14: qty 25, 5d supply, fill #0

## 2021-09-14 MED ORDER — DEXAMETHASONE 1 MG PO TABS: 1.0000 mg | ORAL_TABLET | Freq: Every day | ORAL | 1 refills | Status: DC

## 2021-09-14 NOTE — Progress Notes (Signed)
[facility] [address] [place], [place] [address] [phone]   Interval Evaluation  Date of Service: 09/14/21 Patient Name: Edwin Moreno Patient MRN: [id number] Patient DOB: 06-08-1976 Provider: [name], MD  Identifying Statement:  Edwin Moreno is a 46 y.o. 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 Zada Finders; path is WHO 3 oligodendroglioma, IDH-mt   03/23/2021 - 05/06/2021 Radiation Therapy   IMRT and daily Temodar 66m/m2 (Isidore Moos   06/14/2021 -  Chemotherapy   Patient is on Treatment Plan : BRAIN ANAPLASTIC GLIOMA GRADE III Temozolomide Post XRT q28d       Biomarkers:  MGMT Unknown.  IDH 1/2 Mutated.  1p/19q codeleted  TERT Unknown   Interval History: Edwin Moreno today for follow up, now having completed 3rd cycle of Temodar.  Nausea was better this month, alternating zofran and compazine.  Describes 3 additional seizure events, and at least 10 auras this month.  Dosing lamictal 1578mBID, decadron 77m31maily.  No recent changes with regards to right leg numbness.   H+P (03/02/21) Patient presented to medical attention this July with complaint of several years history of dizziness.  He described episodes of dizziness which would last for seconds to a minute, would come on "at any time".  They did/do not lead to any impairment or dyfunction.  Overall frequency had increased over the past months, to greater than once per week.  CNS imaging demonstrated an enhancing mass within the left frontal lobe, consistent with primary brain tumor.  He underwent craniotomy and resection with Dr. OstZada Finders 02/13/21; path  demonstrates high grade glioma with IDH mutation.  Following surgery, he has not complained of dizzy episodes, or any neurologic issues.  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 (Needs to be seen before next refill) 30 tablet 0   cetirizine (ZYRTEC) 10 MG tablet Take 10 mg by mouth daily.     Cholecalciferol (VITAMIN D3) 50 MCG (2000 UT) TABS Take 2,000 Units by mouth daily.     dexamethasone (DECADRON) 2 MG tablet Take 1 tablet (2 mg total) by mouth daily. 60 tablet 1   Glucosamine HCl 1500 MG TABS Take 1,500 mg by mouth daily.     indomethacin (INDOCIN SR) 75 MG CR capsule Take 75 mg by mouth 2 (two) times daily with a meal.     lamoTRIgine (LAMICTAL) 150 MG tablet Take 1 tablet (150 mg total) by mouth 2 (two) times daily. 60 tablet 3   LORazepam (ATIVAN) 2 MG tablet TAKE ONE TABLET EVERY 6 HOURS AS NEEDED 20 tablet 0   Omega-3 Fatty Acids (FISH OIL) 1200 MG CAPS Take 1,200 mg by mouth daily.     omeprazole (PRILOSEC) 40 MG capsule TAKE (1) CAPSULE DAILY (Needs to be seen before next refill) (Patient taking differently: Take 40 mg by mouth daily.) 30 capsule 0   ondansetron (ZOFRAN) 8 MG tablet Take 1 tablet (8 mg total) by mouth 2 (two) times daily  as needed (nausea and vomiting). May take 30-60 minutes prior to Temodar administration if nausea/vomiting occurs. 30 tablet 1   OVER THE COUNTER MEDICATION Take 1 tablet by mouth daily. [place]     pravastatin (PRAVACHOL) 40 MG tablet TAKE (1/2) TABLET DAILY. (Needs to be seen before next refill) (Patient taking differently: Take 20 mg by mouth daily.) 90 tablet 1   prochlorperazine (COMPAZINE) 10 MG tablet Take 1 tablet (10 mg total) by mouth every 6 (six) hours as needed for nausea or vomiting. 30 tablet 1   zinc gluconate 50 MG  tablet Take 50 mg by mouth daily.     Ascorbic Acid (VITAMIN C) 1000 MG tablet Take 1,000 mg by mouth daily. (Patient not taking: Reported on 04/16/2021)     No current facility-administered medications on file prior to visit.    Allergies: No Known Allergies Past Medical History:  Past Medical History:  Diagnosis Date   Arthritis    Asthma    CHILDHOOD HX   GERD (gastroesophageal reflux disease)    Gout    H/O removal of cyst    hip as a child   Hyperlipidemia    Past Surgical History:  Past Surgical History:  Procedure Laterality Date   APPLICATION OF CRANIAL NAVIGATION N/A 02/13/2021   Procedure: APPLICATION OF CRANIAL NAVIGATION;  Surgeon: Judith Part, MD;  Location: [facility];  Service: Neurosurgery;  Laterality: N/A;  RM 19   CRANIOTOMY Left 02/13/2021   Procedure: Left Craniotomy for tumor resection with brainlab;  Surgeon: Judith Part, MD;  Location: [facility];  Service: Neurosurgery;  Laterality: Left;   Social History:  Social History   Socioeconomic History   Marital status: Married    Spouse name: Not on file   Number of children: Not on file   Years of education: Not on file   Highest education level: Not on file  Occupational History   Not on file  Tobacco Use   Smoking status: Never   Smokeless tobacco: Former    Types: Nurse, children's Use: Never used  Substance and Sexual Activity   Alcohol use: Yes    Comment: 3-4 x weekly- beers   Drug use: No   Sexual activity: Yes  Other Topics Concern   Not on file  Social History Narrative   Not on file   Social Determinants of Health   Financial Resource Strain: Not on file  Food Insecurity: Not on file  Transportation Needs: Not on file  Physical Activity: Not on file  Stress: Not on file  Social Connections: Not on file  Intimate Partner Violence: Not on file   Family History: No family history on file.  Review of Systems: Constitutional: Doesn't report fevers, chills or  abnormal weight loss Eyes: Doesn't report blurriness of vision Ears, nose, mouth, throat, and face: Doesn't report sore throat Respiratory: Doesn't report cough, dyspnea or wheezes Cardiovascular: Doesn't report palpitation, chest discomfort  Gastrointestinal:  Doesn't report nausea, constipation, diarrhea GU: Doesn't report incontinence Skin: Doesn't report skin rashes Neurological: Per HPI Musculoskeletal: Doesn't report joint pain Behavioral/Psych: Doesn't report anxiety  Physical Exam: Vitals:   09/14/21 1023  BP: (!) 142/94  Pulse: 62  Resp: 16  Temp: (!) 97.4 F (36.3 C)  SpO2: 99%    KPS: 90. General: Alert, cooperative, pleasant, in no acute distress Head: Normal EENT: No conjunctival injection or scleral icterus.  Lungs: Resp effort normal Cardiac: Regular rate Abdomen: Non-distended  abdomen Skin: No rashes cyanosis or petechiae. Extremities: No clubbing or edema  Neurologic Exam: Mental Status: Awake, alert, attentive to examiner. Oriented to self and environment. Language is fluent with intact comprehension.  Cranial Nerves: Visual acuity is grossly normal. Visual fields are full. Extra-ocular movements intact. No ptosis. Face is symmetric Motor: Tone and bulk are normal. Power is full in both arms and legs. Reflexes are symmetric, no pathologic reflexes present.  Sensory: Intact to light touch Gait: Normal.   Labs: I have reviewed the data as listed    Component Value Date/Time   NA 141 09/14/2021 1007   NA 141 12/19/2018 1048   K 4.2 09/14/2021 1007   CL 104 09/14/2021 1007   CO2 30 09/14/2021 1007   GLUCOSE 121 (H) 09/14/2021 1007   BUN 17 09/14/2021 1007   BUN 15 12/19/2018 1048   CREATININE 0.93 09/14/2021 1007   CALCIUM 10.3 09/14/2021 1007   PROT 7.1 09/14/2021 1007   PROT 6.9 12/19/2018 1048   ALBUMIN 4.7 09/14/2021 1007   ALBUMIN 5.0 12/19/2018 1048   AST 12 (L) 09/14/2021 1007   ALT 26 09/14/2021 1007   ALKPHOS 63 09/14/2021 1007    BILITOT 0.7 09/14/2021 1007   GFRNONAA >60 09/14/2021 1007   GFRAA 102 12/19/2018 1048   Lab Results  Component Value Date   WBC 7.4 09/14/2021   NEUTROABS 6.1 09/14/2021   HGB 15.8 09/14/2021   HCT 45.4 09/14/2021   MCV 95.4 09/14/2021   PLT 258 09/14/2021   Imaging:  [facility] Clinician Interpretation: I have personally reviewed the CNS images as listed.  My interpretation, in the context of the patient's clinical presentation, is stable disease  MR BRAIN W WO CONTRAST  Result Date: 09/09/2021 CLINICAL DATA:  46 year old male status post craniotomy and resection of left parietal tumor in August 2022. Anaplastic oligodendroglioma with radiation and chemotherapy also. Restaging. EXAM: MRI HEAD WITHOUT AND WITH CONTRAST TECHNIQUE: Multiplanar, multiecho pulse sequences of the brain and surrounding structures were obtained without and with intravenous contrast. CONTRAST:  104m MULTIHANCE GADOBENATE DIMEGLUMINE 529 MG/ML IV SOLN COMPARISON:  06/05/2021 and earlier. FINDINGS: Brain: Left parietal lobe resection cavity (series 2, image 15) with internal CSF signal has not significantly changed in size or configuration since December. Continued mild surrounding T2 and FLAIR hyperintensity, stable and most pronounced on series 7, image 29. No suspicious DWI changes. Stable regional hemosiderin. No regional mass effect. Following contrast no suspicious enhancement. Mild regional postoperative dural thickening has regressed. Outside of the treatment area stable gray and Bisson matter signal, cerebral volume. No restricted diffusion to suggest acute infarction. No midline shift, ventriculomegaly, or acute intracranial hemorrhage. Cervicomedullary junction and pituitary are within normal limits. Vascular: Major intracranial vascular flow voids are stable. The major dural venous sinuses are enhancing and appear to be patent. Skull and upper cervical spine: Stable visualized osseous structures. Negative visible  cervical spine. Sinuses/Orbits: Stable, negative. Other: Mastoids remain clear.  Stable visible scalp and face. IMPRESSION: 1. Stable, satisfactory post treatment appearance of the brain. Stable residual FLAIR hyperintensity with no suspicious enhancement or DWI signal changes now. 2. No new intracranial abnormality. Electronically Signed   By: HGenevie AnnM.D.   On: 09/09/2021 16:20     Assessment/Plan Anaplastic oligodendroglioma, IDH mutant and 1p[place]19q-codeleted ([place]  Focal seizures ([place]  JUSAMA HARKLESSis clinically stable today, now having completed cycle #3 of 5-day Temodar.  MRI demonstrates stable findings.   We recommended continuing treatment with cycle #4 Temozolomide  275m/m2, on for five days and off for twenty three days in twenty eight day cycles. The patient will have a complete blood count performed on days 21 and 28 of each cycle, and a comprehensive metabolic panel performed on day 28 of each cycle. Labs may need to be performed more often. Zofran will prescribed for home use for nausea/vomiting.   For nausea, may continue zofran, compazine and also decadron now at 244mdaily.    Chemotherapy should be held for the following:  ANC less than 1,000  Platelets less than 100,000  LFT or creatinine greater than 2x ULN  If clinical concerns/contraindications develop  Decadron should decrease to 12m27maily if tolerated.  Recommended increasing Lamictal to 200m62mD.  Mood symptoms have improved since Dc'ing Keppra.    Ativan 2mg 12mq6 PRN will also continue prolonged or clustered breakthrough seizures.   We ask that JasonCURRAN LENDERMANrn to clinic in 1 months with labs for evaluation prior to cycle #5.  All questions were answered. The patient knows to call the clinic with any problems, questions or concerns. No barriers to learning were detected.  The total time spent in the encounter was 40 minutes and more than 50% was on counseling and review of test results   ZachaVentura SellersMedical Director of Neuro-Oncology [facility]3/23 10:56 AM

## 2021-09-15 ENCOUNTER — Other Ambulatory Visit (HOSPITAL_COMMUNITY): Payer: Self-pay

## 2021-09-15 ENCOUNTER — Telehealth: Payer: Self-pay | Admitting: Internal Medicine

## 2021-09-15 NOTE — Telephone Encounter (Signed)
Scheduled per 3/13 los, pt has been called and confirmed  ?

## 2021-10-02 ENCOUNTER — Other Ambulatory Visit (HOSPITAL_COMMUNITY): Payer: Self-pay

## 2021-10-26 ENCOUNTER — Telehealth: Payer: Self-pay | Admitting: Internal Medicine

## 2021-10-26 ENCOUNTER — Other Ambulatory Visit (HOSPITAL_COMMUNITY): Payer: Self-pay

## 2021-10-26 ENCOUNTER — Inpatient Hospital Stay (HOSPITAL_BASED_OUTPATIENT_CLINIC_OR_DEPARTMENT_OTHER): Payer: BC Managed Care – PPO | Admitting: Internal Medicine

## 2021-10-26 ENCOUNTER — Inpatient Hospital Stay: Payer: BC Managed Care – PPO | Attending: Internal Medicine

## 2021-10-26 ENCOUNTER — Other Ambulatory Visit: Payer: Self-pay

## 2021-10-26 VITALS — BP 137/98 | HR 87 | Temp 99.2°F | Resp 16 | Wt 256.1 lb

## 2021-10-26 DIAGNOSIS — Z79899 Other long term (current) drug therapy: Secondary | ICD-10-CM | POA: Insufficient documentation

## 2021-10-26 DIAGNOSIS — M109 Gout, unspecified: Secondary | ICD-10-CM | POA: Insufficient documentation

## 2021-10-26 DIAGNOSIS — R42 Dizziness and giddiness: Secondary | ICD-10-CM | POA: Insufficient documentation

## 2021-10-26 DIAGNOSIS — C719 Malignant neoplasm of brain, unspecified: Secondary | ICD-10-CM

## 2021-10-26 DIAGNOSIS — R569 Unspecified convulsions: Secondary | ICD-10-CM | POA: Diagnosis not present

## 2021-10-26 DIAGNOSIS — C711 Malignant neoplasm of frontal lobe: Secondary | ICD-10-CM | POA: Insufficient documentation

## 2021-10-26 LAB — CBC WITH DIFFERENTIAL (CANCER CENTER ONLY)
Abs Immature Granulocytes: 0.05 10*3/uL (ref 0.00–0.07)
Basophils Absolute: 0.1 10*3/uL (ref 0.0–0.1)
Basophils Relative: 1 %
Eosinophils Absolute: 0.1 10*3/uL (ref 0.0–0.5)
Eosinophils Relative: 2 %
HCT: 46.1 % (ref 39.0–52.0)
Hemoglobin: 15.9 g/dL (ref 13.0–17.0)
Immature Granulocytes: 1 %
Lymphocytes Relative: 27 %
Lymphs Abs: 1.3 10*3/uL (ref 0.7–4.0)
MCH: 32.5 pg (ref 26.0–34.0)
MCHC: 34.5 g/dL (ref 30.0–36.0)
MCV: 94.3 fL (ref 80.0–100.0)
Monocytes Absolute: 0.3 10*3/uL (ref 0.1–1.0)
Monocytes Relative: 7 %
Neutro Abs: 2.9 10*3/uL (ref 1.7–7.7)
Neutrophils Relative %: 62 %
Platelet Count: 217 10*3/uL (ref 150–400)
RBC: 4.89 MIL/uL (ref 4.22–5.81)
RDW: 12 % (ref 11.5–15.5)
WBC Count: 4.8 10*3/uL (ref 4.0–10.5)
nRBC: 0 % (ref 0.0–0.2)

## 2021-10-26 LAB — CMP (CANCER CENTER ONLY)
ALT: 45 U/L — ABNORMAL HIGH (ref 0–44)
AST: 24 U/L (ref 15–41)
Albumin: 4.8 g/dL (ref 3.5–5.0)
Alkaline Phosphatase: 88 U/L (ref 38–126)
Anion gap: 7 (ref 5–15)
BUN: 10 mg/dL (ref 6–20)
CO2: 31 mmol/L (ref 22–32)
Calcium: 9.9 mg/dL (ref 8.9–10.3)
Chloride: 102 mmol/L (ref 98–111)
Creatinine: 0.99 mg/dL (ref 0.61–1.24)
GFR, Estimated: >60 mL/min (ref >60)
Glucose, Bld: 98 mg/dL (ref 70–99)
Potassium: 4 mmol/L (ref 3.5–5.1)
Sodium: 140 mmol/L (ref 135–145)
Total Bilirubin: 0.6 mg/dL (ref 0.3–1.2)
Total Protein: 7.4 g/dL (ref 6.5–8.1)

## 2021-10-26 MED ORDER — TEMOZOLOMIDE 100 MG PO CAPS
200.0000 mg/m2/d | ORAL_CAPSULE | Freq: Every day | ORAL | 0 refills | Status: AC
  Filled 2021-10-26 (×2): qty 25, 5d supply, fill #0

## 2021-10-26 NOTE — Telephone Encounter (Signed)
Per 4/24 los called and spoke to pt about upcoming appointment.  Pt confirmed appointment.   ?

## 2021-10-26 NOTE — Progress Notes (Signed)
? ?[facility] [address]  ?[place], [place] [address] ?[phone] ? ? ?Interval Evaluation ? ?Date of Service: 10/26/21 ?Patient Name: Edwin Moreno ?Patient MRN: [id number] ?Patient DOB: July 18, 1975 ?Provider: [name], MD ? ?Identifying Statement:  ?Edwin Moreno is a 46 y.o. 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 ? ?Oncologic History: ?Oncology History  ?Anaplastic oligodendroglioma, IDH mutant and 1p/19q-codeleted ([place])  ?02/13/2021 Initial Diagnosis  ? Anaplastic oligodendroglioma, IDH mutant and 1p/19q-codeleted ([place]) ?  ?02/13/2021 Surgery  ? Craniotomy, resection of left frontal mass by Dr. Zada Finders; path is WHO 3 oligodendroglioma, IDH-mt ?  ?03/23/2021 - 05/06/2021 Radiation Therapy  ? IMRT and daily Temodar 29m/m2 (Isidore Moos ?  ?06/14/2021 -  Chemotherapy  ? Patient is on Treatment Plan : BRAIN ANAPLASTIC GLIOMA GRADE III Temozolomide Post XRT q28d  ? ?  ?  ? ? ?Biomarkers: ? ?MGMT Unknown.  ?IDH 1/2 Mutated.  ?1p/19q codeleted  ?TERT Unknown  ? ?Interval History: ?Edwin BARTNIKpresents today for follow up, now having completed cycle #4 of Temodar.  No issues with nausea.  Describes 3 an additional seizure events, though 2 were just auras with minimal effect.  Dosing lamictal 2064mBID, decadron 104m12maily.  No recent changes with regards to right leg numbness.  ? ?H+P (03/02/21) Patient presented to medical attention this July with complaint of several years history of dizziness.  He described episodes of dizziness which would last for seconds to a minute, would come on "at any time".  They did/do not lead to any impairment or dyfunction.  Overall frequency had increased over the past months, to greater than once per week.  CNS imaging demonstrated an enhancing mass within the left frontal lobe, consistent with primary brain tumor.  He underwent craniotomy and resection with Dr. OstZada Finders 02/13/21; path demonstrates high grade glioma  with IDH mutation.  Following surgery, he has not complained of dizzy episodes, or any neurologic issues.  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 allopurinol (ZYLOPRIM) 300 MG tablet Take 1 tablet (300 mg total) by mouth daily. (Needs to be seen before next refill) 30 tablet 0  ? cetirizine (ZYRTEC) 10 MG tablet Take 10 mg by mouth daily.    ? Cholecalciferol (VITAMIN D3) 50 MCG (2000 UT) TABS Take 2,000 Units by mouth daily.    ? Glucosamine HCl 1500 MG TABS Take 1,500 mg by mouth daily.    ? indomethacin (INDOCIN SR) 75 MG CR capsule Take 75 mg by mouth 2 (two) times daily with a meal.    ? lamoTRIgine (LAMICTAL) 200 MG tablet Take 1 tablet (200 mg total) by mouth 2 (two) times daily. 60 tablet 3  ? LORazepam (ATIVAN) 2 MG tablet TAKE ONE TABLET EVERY 6 HOURS AS NEEDED 20 tablet 0  ? Omega-3 Fatty Acids (FISH OIL) 1200 MG CAPS Take 1,200 mg by mouth daily.    ? omeprazole (PRILOSEC) 40 MG capsule TAKE (1) CAPSULE DAILY (Needs to be seen before next refill) (Patient taking differently: Take 40 mg by mouth daily.) 30 capsule 0  ? ondansetron (ZOFRAN) 8 MG tablet Take 1 tablet (8 mg total) by mouth 2 (two) times daily as needed (nausea and vomiting). May take 30-60 minutes prior to Temodar administration if nausea/vomiting occurs. 30 tablet 1  ?  OVER THE COUNTER MEDICATION Take 1 tablet by mouth daily. [place]    ? pravastatin (PRAVACHOL) 40 MG tablet TAKE (1/2) TABLET DAILY. (Needs to be seen before next refill) (Patient taking differently: Take 20 mg by mouth daily.) 90 tablet 1  ? prochlorperazine (COMPAZINE) 10 MG tablet Take 1 tablet (10 mg total) by mouth every 6 (six) hours as needed for nausea or vomiting. 30 tablet 1  ? zinc gluconate 50 MG tablet Take 50 mg by mouth daily.    ? Ascorbic Acid (VITAMIN C) 1000 MG tablet Take 1,000 mg by mouth daily. (Patient not  taking: Reported on 04/16/2021)    ? ?No current facility-administered medications on file prior to visit.  ? ? ?Allergies: No Known Allergies ?Past Medical History:  ?Past Medical History:  ?Diagnosis Date  ? Arthritis   ? Asthma   ? CHILDHOOD HX  ? GERD (gastroesophageal reflux disease)   ? Gout   ? H/O removal of cyst   ? hip as a child  ? Hyperlipidemia   ? ?Past Surgical History:  ?Past Surgical History:  ?Procedure Laterality Date  ? APPLICATION OF CRANIAL NAVIGATION N/A 02/13/2021  ? Procedure: APPLICATION OF CRANIAL NAVIGATION;  Surgeon: Judith Part, MD;  Location: [facility];  Service: Neurosurgery;  Laterality: N/A;  RM 19  ? CRANIOTOMY Left 02/13/2021  ? Procedure: Left Craniotomy for tumor resection with brainlab;  Surgeon: Judith Part, MD;  Location: [facility];  Service: Neurosurgery;  Laterality: Left;  ? ?Social History:  ?Social History  ? ?Socioeconomic History  ? Marital status: Married  ?  Spouse name: Not on file  ? Number of children: Not on file  ? Years of education: Not on file  ? Highest education level: Not on file  ?Occupational History  ? Not on file  ?Tobacco Use  ? Smoking status: Never  ? Smokeless tobacco: Former  ?  Types: Chew  ?Vaping Use  ? Vaping Use: Never used  ?Substance and Sexual Activity  ? Alcohol use: Yes  ?  Comment: 3-4 x weekly- beers  ? Drug use: No  ? Sexual activity: Yes  ?Other Topics Concern  ? Not on file  ?Social History Narrative  ? Not on file  ? ?Social Determinants of Health  ? ?Financial Resource Strain: Not on file  ?Food Insecurity: Not on file  ?Transportation Needs: Not on file  ?Physical Activity: Not on file  ?Stress: Not on file  ?Social Connections: Not on file  ?Intimate Partner Violence: Not on file  ? ?Family History: No family history on file. ? ?Review of Systems: ?Constitutional: Doesn't report fevers, chills or abnormal weight loss ?Eyes: Doesn't report blurriness of vision ?Ears, nose, mouth, throat, and face: Doesn't report sore  throat ?Respiratory: Doesn't report cough, dyspnea or wheezes ?Cardiovascular: Doesn't report palpitation, chest discomfort  ?Gastrointestinal:  Doesn't report nausea, constipation, diarrhea ?GU: Doesn't report incontinence ?Skin: Doesn't report skin rashes ?Neurological: Per HPI ?Musculoskeletal: Doesn't report joint pain ?Behavioral/Psych: Doesn't report anxiety ? ?Physical Exam: ?Vitals:  ? 10/26/21 1047  ?BP: (!) 137/98  ?Pulse: 87  ?Resp: 16  ?Temp: 99.2 ?F (37.3 ?C)  ?SpO2: 100%  ? ? ?KPS: 90. ?General: Alert, cooperative, pleasant, in no acute distress ?Head: Normal ?EENT: No conjunctival injection or scleral icterus.  ?Lungs: Resp effort normal ?Cardiac: Regular rate ?Abdomen: Non-distended abdomen ?Skin: No rashes cyanosis or petechiae. ?Extremities: No clubbing or edema ? ?Neurologic Exam: ?Mental Status: Awake, alert, attentive to examiner.  Oriented to self and environment. Language is fluent with intact comprehension.  ?Cranial Nerves: Visual acuity is grossly normal. Visual fields are full. Extra-ocular movements intact. No ptosis. Face is symmetric ?Motor: Tone and bulk are normal. Power is full in both arms and legs. Reflexes are symmetric, no pathologic reflexes present.  ?Sensory: Intact to light touch ?Gait: Normal. ? ? ?Labs: ?I have reviewed the data as listed ?   ?Component Value Date/Time  ? NA 140 10/26/2021 0959  ? NA 141 12/19/2018 1048  ? K 4.0 10/26/2021 0959  ? CL 102 10/26/2021 0959  ? CO2 31 10/26/2021 0959  ? GLUCOSE 98 10/26/2021 0959  ? BUN 10 10/26/2021 0959  ? BUN 15 12/19/2018 1048  ? CREATININE 0.99 10/26/2021 0959  ? CALCIUM 9.9 10/26/2021 0959  ? PROT 7.4 10/26/2021 0959  ? PROT 6.9 12/19/2018 1048  ? ALBUMIN 4.8 10/26/2021 0959  ? ALBUMIN 5.0 12/19/2018 1048  ? AST 24 10/26/2021 0959  ? ALT 45 (H) 10/26/2021 0959  ? ALKPHOS 88 10/26/2021 0959  ? BILITOT 0.6 10/26/2021 0959  ? GFRNONAA >60 10/26/2021 0959  ? GFRAA 102 12/19/2018 1048  ? ?Lab Results  ?Component Value Date  ?  WBC 4.8 10/26/2021  ? NEUTROABS 2.9 10/26/2021  ? HGB 15.9 10/26/2021  ? HCT 46.1 10/26/2021  ? MCV 94.3 10/26/2021  ? PLT 217 10/26/2021  ? ?Imaging: ? ?[facility] Clinician Interpretation: I have personally reviewed

## 2021-11-10 ENCOUNTER — Other Ambulatory Visit: Payer: Self-pay | Admitting: Radiation Therapy

## 2021-11-19 ENCOUNTER — Other Ambulatory Visit (HOSPITAL_COMMUNITY): Payer: Self-pay

## 2021-11-23 ENCOUNTER — Other Ambulatory Visit (HOSPITAL_COMMUNITY): Payer: Self-pay

## 2021-11-27 ENCOUNTER — Telehealth: Payer: Self-pay | Admitting: *Deleted

## 2021-11-27 ENCOUNTER — Ambulatory Visit
Admission: RE | Admit: 2021-11-27 | Discharge: 2021-11-27 | Disposition: A | Discharge status: Home / Self Care | Payer: BC Managed Care – PPO | Source: Ambulatory Visit | Attending: Internal Medicine | Admitting: Internal Medicine

## 2021-11-27 DIAGNOSIS — C719 Malignant neoplasm of brain, unspecified: Secondary | ICD-10-CM

## 2021-11-27 MED ORDER — GADOBENATE DIMEGLUMINE 529 MG/ML IV SOLN
20.0000 mL | Freq: Once | INTRAVENOUS | Status: AC | PRN
  Administered 2021-11-27: 20 mL via INTRAVENOUS

## 2021-11-27 NOTE — Telephone Encounter (Signed)
Collis states he needs to be able to pick up his Temodar by 5/31 due to insurance. He states [facility] usually has to order it and if he gets it filled 6/1 or later he will have to pay out of pocket. Insurance will resume 01/02/22.

## 2021-11-27 NOTE — Telephone Encounter (Signed)
Edwin Moreno states he needs to be able to pick up his Temodar by 5/31 due to insurance. He states [facility] usually has to order it and if he gets it filled 6/1 or later he will have to pay out of pocket. Insurance will resume 01/02/22.

## 2021-12-01 ENCOUNTER — Other Ambulatory Visit (HOSPITAL_COMMUNITY): Payer: Self-pay

## 2021-12-01 ENCOUNTER — Inpatient Hospital Stay: Payer: BC Managed Care – PPO | Attending: Internal Medicine | Admitting: Internal Medicine

## 2021-12-01 ENCOUNTER — Other Ambulatory Visit: Payer: Self-pay

## 2021-12-01 VITALS — BP 141/86 | Temp 98.6°F | Resp 20 | Wt 262.3 lb

## 2021-12-01 DIAGNOSIS — R42 Dizziness and giddiness: Secondary | ICD-10-CM | POA: Diagnosis not present

## 2021-12-01 DIAGNOSIS — C719 Malignant neoplasm of brain, unspecified: Secondary | ICD-10-CM

## 2021-12-01 DIAGNOSIS — R569 Unspecified convulsions: Secondary | ICD-10-CM

## 2021-12-01 DIAGNOSIS — R112 Nausea with vomiting, unspecified: Secondary | ICD-10-CM | POA: Insufficient documentation

## 2021-12-01 DIAGNOSIS — C711 Malignant neoplasm of frontal lobe: Secondary | ICD-10-CM | POA: Insufficient documentation

## 2021-12-01 DIAGNOSIS — Z79899 Other long term (current) drug therapy: Secondary | ICD-10-CM | POA: Diagnosis not present

## 2021-12-01 DIAGNOSIS — Z923 Personal history of irradiation: Secondary | ICD-10-CM | POA: Diagnosis not present

## 2021-12-01 MED ORDER — TEMOZOLOMIDE 100 MG PO CAPS
200.0000 mg/m2/d | ORAL_CAPSULE | Freq: Every day | ORAL | 0 refills | Status: AC
  Filled 2021-12-01: qty 25, 5d supply, fill #0
  Filled 2021-12-01: qty 5, 1d supply, fill #0
  Filled 2021-12-01: qty 25, 5d supply, fill #0

## 2021-12-01 NOTE — Progress Notes (Signed)
[facility] at [address], [place] [address] [phone]   Interval Evaluation  Date of Service: 12/01/21 Patient Name: Edwin Moreno Patient MRN: [id number] Patient DOB: Nov 21, 1975 Provider: [name], MD  Identifying Statement:  Edwin Moreno is a 46 y.o. 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 Zada Finders; path is WHO 3 oligodendroglioma, IDH-mt   03/23/2021 - 05/06/2021 Radiation Therapy   IMRT and daily Temodar 71m/m2 (Isidore Moos   06/14/2021 -  Chemotherapy   Patient is on Treatment Plan : BRAIN ANAPLASTIC GLIOMA GRADE III Temozolomide Post XRT q28d        Biomarkers:  MGMT Unknown.  IDH 1/2 Mutated.  1p/19q codeleted  TERT Unknown   Interval History: Edwin ADAMCZAKpresents today for follow up, now having completed cycle #5 of Temodar.  No issues with nausea.  He did have one mild seizure this mont, self limited.  Dosing lamictal 2012mBID.  No recent changes with regards to right leg numbness.   H+P (03/02/21) Patient presented to medical attention this July with complaint of several years history of dizziness.  He described episodes of dizziness which would last for seconds to a minute, would come on "at any time".  They did/do not lead to any impairment or dyfunction.  Overall frequency had increased over the past months, to greater than once per week.  CNS imaging demonstrated an enhancing mass within the left frontal lobe, consistent with primary brain tumor.  He underwent craniotomy and resection with Dr. OsZada Findersn 02/13/21; path demonstrates high grade glioma with IDH mutation.  Following surgery, he has not  complained of dizzy episodes, or any neurologic issues.  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 (Needs to be seen before next refill) 30 tablet 0   Ascorbic Acid (VITAMIN C) 1000 MG tablet Take 1,000 mg by mouth daily. (Patient not taking: Reported on 04/16/2021)     cetirizine (ZYRTEC) 10 MG tablet Take 10 mg by mouth daily.     Cholecalciferol (VITAMIN D3) 50 MCG (2000 UT) TABS Take 2,000 Units by mouth daily.     Glucosamine HCl 1500 MG TABS Take 1,500 mg by mouth daily.     indomethacin (INDOCIN SR) 75 MG CR capsule Take 75 mg by mouth 2 (two) times daily with a meal.     lamoTRIgine (LAMICTAL) 200 MG tablet Take 1 tablet (200 mg total) by mouth 2 (two) times daily. 60 tablet 3   LORazepam (ATIVAN) 2 MG tablet TAKE ONE TABLET EVERY 6 HOURS AS NEEDED 20 tablet 0   Omega-3 Fatty Acids (FISH OIL) 1200 MG CAPS Take 1,200 mg by mouth daily.     omeprazole (PRILOSEC) 40 MG capsule TAKE (1) CAPSULE DAILY (Needs to be seen before next refill) (Patient taking differently: Take 40 mg by mouth daily.) 30 capsule 0   ondansetron (ZOFRAN) 8 MG tablet Take 1 tablet (8 mg total) by mouth 2 (two) times daily as needed (nausea and vomiting).  May take 30-60 minutes prior to Temodar administration if nausea/vomiting occurs. 30 tablet 1   OVER THE COUNTER MEDICATION Take 1 tablet by mouth daily. [place]     pravastatin (PRAVACHOL) 40 MG tablet TAKE (1/2) TABLET DAILY. (Needs to be seen before next refill) (Patient taking differently: Take 20 mg by mouth daily.) 90 tablet 1   prochlorperazine (COMPAZINE) 10 MG tablet Take 1 tablet (10 mg total) by mouth every 6 (six) hours as needed for nausea or vomiting. 30 tablet 1   zinc gluconate 50 MG tablet Take 50 mg by mouth daily.     No current  facility-administered medications on file prior to visit.    Allergies: No Known Allergies Past Medical History:  Past Medical History:  Diagnosis Date   Arthritis    Asthma    CHILDHOOD HX   GERD (gastroesophageal reflux disease)    Gout    H/O removal of cyst    hip as a child   Hyperlipidemia    Past Surgical History:  Past Surgical History:  Procedure Laterality Date   APPLICATION OF CRANIAL NAVIGATION N/A 02/13/2021   Procedure: APPLICATION OF CRANIAL NAVIGATION;  Surgeon: Judith Part, MD;  Location: [facility];  Service: Neurosurgery;  Laterality: N/A;  RM 19   CRANIOTOMY Left 02/13/2021   Procedure: Left Craniotomy for tumor resection with brainlab;  Surgeon: Judith Part, MD;  Location: [facility];  Service: Neurosurgery;  Laterality: Left;   Social History:  Social History   Socioeconomic History   Marital status: Married    Spouse name: Not on file   Number of children: Not on file   Years of education: Not on file   Highest education level: Not on file  Occupational History   Not on file  Tobacco Use   Smoking status: Never   Smokeless tobacco: Former    Types: Nurse, children's Use: Never used  Substance and Sexual Activity   Alcohol use: Yes    Comment: 3-4 x weekly- beers   Drug use: No   Sexual activity: Yes  Other Topics Concern   Not on file  Social History Narrative   Not on file   Social Determinants of Health   Financial Resource Strain: Not on file  Food Insecurity: Not on file  Transportation Needs: Not on file  Physical Activity: Not on file  Stress: Not on file  Social Connections: Not on file  Intimate Partner Violence: Not on file   Family History: No family history on file.  Review of Systems: Constitutional: Doesn't report fevers, chills or abnormal weight loss Eyes: Doesn't report blurriness of vision Ears, nose, mouth, throat, and face: Doesn't report sore throat Respiratory: Doesn't report cough, dyspnea or  wheezes Cardiovascular: Doesn't report palpitation, chest discomfort  Gastrointestinal:  Doesn't report nausea, constipation, diarrhea GU: Doesn't report incontinence Skin: Doesn't report skin rashes Neurological: Per HPI Musculoskeletal: Doesn't report joint pain Behavioral/Psych: Doesn't report anxiety  Physical Exam: Vitals:   12/01/21 1008  BP: (!) 141/86  Resp: 20  Temp: 98.6 F (37 C)  SpO2: 99%   KPS: 90. General: Alert, cooperative, pleasant, in no acute distress Head: Normal EENT: No conjunctival injection or scleral icterus.  Lungs: Resp effort normal Cardiac: Regular rate Abdomen: Non-distended abdomen Skin: No rashes cyanosis or petechiae. Extremities: No clubbing or edema  Neurologic Exam: Mental Status: Awake, alert, attentive to examiner. Oriented to self and environment. Language is fluent with intact comprehension.  Cranial Nerves: Visual acuity is grossly normal. Visual fields are full. Extra-ocular movements intact. No ptosis. Face is symmetric Motor: Tone and bulk are normal. Power is full in both arms and legs. Reflexes are symmetric, no pathologic reflexes present.  Sensory: Intact to light touch Gait: Normal.   Labs: I have reviewed the data as listed    Component Value Date/Time   NA 140 10/26/2021 0959   NA 141 12/19/2018 1048   K 4.0 10/26/2021 0959   CL 102 10/26/2021 0959   CO2 31 10/26/2021 0959   GLUCOSE 98 10/26/2021 0959   BUN 10 10/26/2021 0959   BUN 15 12/19/2018 1048   CREATININE 0.99 10/26/2021 0959   CALCIUM 9.9 10/26/2021 0959   PROT 7.4 10/26/2021 0959   PROT 6.9 12/19/2018 1048   ALBUMIN 4.8 10/26/2021 0959   ALBUMIN 5.0 12/19/2018 1048   AST 24 10/26/2021 0959   ALT 45 (H) 10/26/2021 0959   ALKPHOS 88 10/26/2021 0959   BILITOT 0.6 10/26/2021 0959   GFRNONAA >60 10/26/2021 0959   GFRAA 102 12/19/2018 1048   Lab Results  Component Value Date   WBC 4.8 10/26/2021   NEUTROABS 2.9 10/26/2021   HGB 15.9 10/26/2021    HCT 46.1 10/26/2021   MCV 94.3 10/26/2021   PLT 217 10/26/2021   Imaging:  [place] [facility] Clinician Interpretation: I have personally reviewed the CNS images as listed.  My interpretation, in the context of the patient's clinical presentation, is treatment effect vs true progression  MR BRAIN W WO CONTRAST  Result Date: 11/28/2021 CLINICAL DATA:  Left parietal anaplastic oligodendroglioma, status post radiation, chemotherapy, and surgery, follow-up EXAM: MRI HEAD WITHOUT AND WITH CONTRAST TECHNIQUE: Multiplanar, multiecho pulse sequences of the brain and surrounding structures were obtained without and with intravenous contrast. CONTRAST:  77m MULTIHANCE GADOBENATE DIMEGLUMINE 529 MG/ML IV SOLN COMPARISON:  09/09/2021 FINDINGS: Brain: Status post left parietal craniotomy with subjacent resection cavity. Minimal linear intrinsic T1 hyperintense signal and enhancement is again seen about the inferior aspect of the resection cavity (series 17, image 107 and 113), as seen on the 06/05/2021 exam, but not seen as well on the 09/09/2021 exam, with a new nodular focus of enhancement along the posteroinferior aspect of the resection cavity (series 17, image 108). Unchanged T2 hyperintense signal about the resection cavity. No abnormal signal on diffusion-weighted imaging. No acute infarct, hemorrhage, mass effect, or midline shift. No hydrocephalus or acute extra-axial collection. Vascular: Normal arterial flow voids. Venous sinuses are patent on postcontrast imaging. Skull and upper cervical spine: Left parietal craniotomy. Otherwise normal marrow signal. Sinuses/Orbits: No acute finding. Other: The mastoids are well aerated. IMPRESSION: 1. Punctate new focus of enhancement along the posteroinferior aspect of the resection cavity, which is nonspecific but could indicate recurrence. Attention on follow-up. 2. Otherwise unchanged appearance of the left parietal resection cavity and unchanged surrounding T2 hyperintense  signal. Electronically Signed   By: AMerilyn BabaM.D.   On: 11/28/2021 02:26     Assessment/Plan Anaplastic oligodendroglioma, IDH mutant and 1p/19q-codeleted ([place]  Focal seizures ([place]  Edwin BONDARis clinically stable today, now having completed cycle #5 of 5-day Temodar.  MRI brain demonstrates new enhancing nodule posterior to the resection cavity.  Etiology is either post-RT effects or early tumor recurrence.  We will continue to monitor with imaging.  We recommended continuing treatment with cycle #6 Temozolomide 2019mm2, on for five days and off for twenty three days in twenty eight day cycles. The patient will have a  complete blood count performed on days 21 and 28 of each cycle, and a comprehensive metabolic panel performed on day 28 of each cycle. Labs may need to be performed more often. Zofran will prescribed for home use for nausea/vomiting.   For nausea, may continue zofran, compazine.  Chemotherapy should be held for the following:  ANC less than 1,000  Platelets less than 100,000  LFT or creatinine greater than 2x ULN  If clinical concerns/contraindications develop  Decadron is at 13m daily.  Will con't Lamictal to 2031mBID, he would prefer not to add a second AED at this time.    Ativan 45m37mR q6 PRN will also continue prolonged or clustered breakthrough seizures.   We ask that JasCALIEB LICHTMANturn to clinic in 1 months with MRI brain for evaluation prior to cycle #6.  All questions were answered. The patient knows to call the clinic with any problems, questions or concerns. No barriers to learning were detected.  The total time spent in the encounter was 40 minutes and more than 50% was on counseling and review of test results   ZacVentura SellersD Medical Director of Neuro-Oncology [facility] [place]/30/23 10:11 AM

## 2021-12-02 ENCOUNTER — Telehealth: Payer: Self-pay | Admitting: Internal Medicine

## 2021-12-02 ENCOUNTER — Other Ambulatory Visit (HOSPITAL_COMMUNITY): Payer: Self-pay

## 2021-12-02 NOTE — Telephone Encounter (Signed)
Per 5/30 los called and spoke to pt about appointment pt confirmed appointment

## 2021-12-07 ENCOUNTER — Inpatient Hospital Stay: Payer: BC Managed Care – PPO

## 2021-12-09 ENCOUNTER — Other Ambulatory Visit: Payer: Self-pay | Admitting: Family Medicine

## 2021-12-10 ENCOUNTER — Other Ambulatory Visit: Payer: Self-pay | Admitting: *Deleted

## 2021-12-10 DIAGNOSIS — C719 Malignant neoplasm of brain, unspecified: Secondary | ICD-10-CM

## 2021-12-11 ENCOUNTER — Telehealth: Payer: Self-pay

## 2021-12-11 ENCOUNTER — Other Ambulatory Visit: Payer: Self-pay | Admitting: Radiation Therapy

## 2021-12-11 NOTE — Telephone Encounter (Signed)
Patient called to state that his new insurance starts on 01/02/22 and he would like MRI to be scheduled after his new insurance starts. MRI scheduled for 01/06/22 and apt with Dr. Mickeal Skinner scheduled for 7/10 @ 9:30. Pt added to tumor board on 7/10.23

## 2021-12-14 ENCOUNTER — Telehealth: Payer: Self-pay | Admitting: *Deleted

## 2021-12-14 MED ORDER — LAMOTRIGINE 200 MG PO TABS
200.0000 mg | ORAL_TABLET | Freq: Two times a day (BID) | ORAL | 1 refills | Status: DC
Start: 2021-12-14 — End: 2022-08-10

## 2021-12-14 NOTE — Telephone Encounter (Signed)
Patient called requesting refill of his Lamitcal for 90 day supply to pharmacy on file.  States he has been having some minor aura's but nothing that he can not manage and doesn't feel like he needs any dose changes.  Routing to provider to process 90 day refill for patient.

## 2021-12-14 NOTE — Addendum Note (Signed)
Addended by: [name] on: 12/14/2021 04:05 PM   Modules accepted: Orders

## 2021-12-17 ENCOUNTER — Other Ambulatory Visit (HOSPITAL_COMMUNITY): Payer: Self-pay

## 2021-12-22 ENCOUNTER — Other Ambulatory Visit (HOSPITAL_COMMUNITY): Payer: Self-pay

## 2021-12-31 ENCOUNTER — Other Ambulatory Visit: Payer: BC Managed Care – PPO

## 2021-12-31 ENCOUNTER — Ambulatory Visit: Payer: BC Managed Care – PPO | Admitting: Internal Medicine

## 2022-01-06 ENCOUNTER — Ambulatory Visit
Admission: RE | Admit: 2022-01-06 | Discharge: 2022-01-06 | Disposition: A | Discharge status: Home / Self Care | Payer: BC Managed Care – PPO | Source: Ambulatory Visit | Attending: Internal Medicine | Admitting: Internal Medicine

## 2022-01-06 DIAGNOSIS — C719 Malignant neoplasm of brain, unspecified: Secondary | ICD-10-CM

## 2022-01-06 MED ORDER — GADOBENATE DIMEGLUMINE 529 MG/ML IV SOLN
20.0000 mL | Freq: Once | INTRAVENOUS | Status: AC | PRN
  Administered 2022-01-06: 20 mL via INTRAVENOUS

## 2022-01-11 ENCOUNTER — Inpatient Hospital Stay: Payer: BC Managed Care – PPO | Attending: Internal Medicine | Admitting: Internal Medicine

## 2022-01-11 ENCOUNTER — Inpatient Hospital Stay: Payer: BC Managed Care – PPO

## 2022-01-11 ENCOUNTER — Other Ambulatory Visit (HOSPITAL_COMMUNITY): Payer: Self-pay

## 2022-01-11 ENCOUNTER — Other Ambulatory Visit: Payer: Self-pay | Admitting: Internal Medicine

## 2022-01-11 ENCOUNTER — Other Ambulatory Visit: Payer: Self-pay

## 2022-01-11 VITALS — BP 120/85 | HR 57 | Temp 97.9°F | Resp 16 | Wt 257.4 lb

## 2022-01-11 DIAGNOSIS — Z79899 Other long term (current) drug therapy: Secondary | ICD-10-CM | POA: Insufficient documentation

## 2022-01-11 DIAGNOSIS — C719 Malignant neoplasm of brain, unspecified: Secondary | ICD-10-CM

## 2022-01-11 DIAGNOSIS — R42 Dizziness and giddiness: Secondary | ICD-10-CM | POA: Insufficient documentation

## 2022-01-11 DIAGNOSIS — R569 Unspecified convulsions: Secondary | ICD-10-CM | POA: Diagnosis not present

## 2022-01-11 DIAGNOSIS — C711 Malignant neoplasm of frontal lobe: Secondary | ICD-10-CM | POA: Diagnosis present

## 2022-01-11 LAB — CBC WITH DIFFERENTIAL (CANCER CENTER ONLY)
Abs Immature Granulocytes: 0.03 10*3/uL (ref 0.00–0.07)
Basophils Absolute: 0 10*3/uL (ref 0.0–0.1)
Basophils Relative: 1 %
Eosinophils Absolute: 0.1 10*3/uL (ref 0.0–0.5)
Eosinophils Relative: 1 %
HCT: 43.6 % (ref 39.0–52.0)
Hemoglobin: 15.3 g/dL (ref 13.0–17.0)
Immature Granulocytes: 1 %
Lymphocytes Relative: 22 %
Lymphs Abs: 1 10*3/uL (ref 0.7–4.0)
MCH: 33.3 pg (ref 26.0–34.0)
MCHC: 35.1 g/dL (ref 30.0–36.0)
MCV: 95 fL (ref 80.0–100.0)
Monocytes Absolute: 0.3 10*3/uL (ref 0.1–1.0)
Monocytes Relative: 7 %
Neutro Abs: 3 10*3/uL (ref 1.7–7.7)
Neutrophils Relative %: 68 %
Platelet Count: 198 10*3/uL (ref 150–400)
RBC: 4.59 MIL/uL (ref 4.22–5.81)
RDW: 11.9 % (ref 11.5–15.5)
WBC Count: 4.4 10*3/uL (ref 4.0–10.5)
nRBC: 0 % (ref 0.0–0.2)

## 2022-01-11 LAB — CMP (CANCER CENTER ONLY)
ALT: 34 U/L (ref 0–44)
AST: 23 U/L (ref 15–41)
Albumin: 4.8 g/dL (ref 3.5–5.0)
Alkaline Phosphatase: 79 U/L (ref 38–126)
Anion gap: 6 (ref 5–15)
BUN: 18 mg/dL (ref 6–20)
CO2: 30 mmol/L (ref 22–32)
Calcium: 9.8 mg/dL (ref 8.9–10.3)
Chloride: 105 mmol/L (ref 98–111)
Creatinine: 1.01 mg/dL (ref 0.61–1.24)
GFR, Estimated: >60 mL/min (ref >60)
Glucose, Bld: 91 mg/dL (ref 70–99)
Potassium: 4.6 mmol/L (ref 3.5–5.1)
Sodium: 141 mmol/L (ref 135–145)
Total Bilirubin: 0.8 mg/dL (ref 0.3–1.2)
Total Protein: 7.2 g/dL (ref 6.5–8.1)

## 2022-01-11 MED ORDER — LORAZEPAM 2 MG PO TABS: ORAL_TABLET | ORAL | 0 refills | Status: DC

## 2022-01-11 NOTE — Telephone Encounter (Signed)
Duplicate request

## 2022-01-11 NOTE — Progress Notes (Signed)
[facility] at [place], [place] [address] [phone]   Interval Evaluation  Date of Service: 01/11/22 Patient Name: Edwin Moreno Patient MRN: [id number] Patient DOB: 08/23/1975 Provider: [name], MD  Identifying Statement:  Edwin Moreno is a 46 y.o. 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 Zada Finders; path is WHO 3 oligodendroglioma, IDH-mt   03/23/2021 - 05/06/2021 Radiation Therapy   IMRT and daily Temodar 71m/m2 (Isidore Moos   06/14/2021 -  Chemotherapy   Patient is on Treatment Plan : BRAIN ANAPLASTIC GLIOMA GRADE III Temozolomide Post XRT q28d       Biomarkers:  MGMT Unknown.  IDH 1/2 Mutated.  1p/19q codeleted  TERT Unknown   Interval History: JRENLY ROOTSpresents today for follow up, now having completed cycle #6 of Temodar.  No issues with nausea.  No further seizures this month, he does continue to have occasional auras.  Dosing lamictal 209mBID.  No recent changes with regards to right leg numbness.   H+P (03/02/21) Patient presented to medical attention this July with complaint of several years history of dizziness.  He described episodes of dizziness which would last for seconds to a minute, would come on "at any time".  They did/do not lead to any impairment or dyfunction.  Overall frequency had increased over the past months, to greater than once per week.  CNS imaging demonstrated an enhancing mass within the left frontal lobe, consistent with primary brain tumor.  He underwent craniotomy and resection with Dr. OsZada Findersn 02/13/21; path demonstrates high grade glioma with IDH mutation.  Following  surgery, he has not complained of dizzy episodes, or any neurologic issues.  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 (Needs to be seen before next refill) 30 tablet 0   Ascorbic Acid (VITAMIN C) 1000 MG tablet Take 1,000 mg by mouth daily. (Patient not taking: Reported on 04/16/2021)     cetirizine (ZYRTEC) 10 MG tablet Take 10 mg by mouth daily.     Cholecalciferol (VITAMIN D3) 50 MCG (2000 UT) TABS Take 2,000 Units by mouth daily.     Glucosamine HCl 1500 MG TABS Take 1,500 mg by mouth daily.     indomethacin (INDOCIN SR) 75 MG CR capsule Take 75 mg by mouth 2 (two) times daily with a meal.     lamoTRIgine (LAMICTAL) 200 MG tablet Take 1 tablet (200 mg total) by mouth 2 (two) times daily. 180 tablet 1   LORazepam (ATIVAN) 2 MG tablet TAKE ONE TABLET EVERY 6 HOURS AS NEEDED 20 tablet 0   Omega-3 Fatty Acids (FISH OIL) 1200 MG CAPS Take 1,200 mg by mouth daily.     omeprazole (PRILOSEC) 40 MG capsule TAKE (1) CAPSULE DAILY (Needs to be seen before next refill) (Patient taking differently: Take 40 mg by mouth daily.) 30 capsule 0   ondansetron (ZOFRAN) 8 MG tablet Take 1 tablet (8 mg total) by mouth 2 (two) times daily as needed (nausea and  vomiting). May take 30-60 minutes prior to Temodar administration if nausea/vomiting occurs. 30 tablet 1   OVER THE COUNTER MEDICATION Take 1 tablet by mouth daily. [place]     pravastatin (PRAVACHOL) 40 MG tablet TAKE (1/2) TABLET DAILY. (Needs to be seen before next refill) (Patient taking differently: Take 20 mg by mouth daily.) 90 tablet 1   prochlorperazine (COMPAZINE) 10 MG tablet Take 1 tablet (10 mg total) by mouth every 6 (six) hours as needed for nausea or vomiting. 30 tablet 1   zinc gluconate 50 MG tablet Take 50 mg by mouth daily.      No current facility-administered medications on file prior to visit.    Allergies: No Known Allergies Past Medical History:  Past Medical History:  Diagnosis Date   Arthritis    Asthma    CHILDHOOD HX   GERD (gastroesophageal reflux disease)    Gout    H/O removal of cyst    hip as a child   Hyperlipidemia    Past Surgical History:  Past Surgical History:  Procedure Laterality Date   APPLICATION OF CRANIAL NAVIGATION N/A 02/13/2021   Procedure: APPLICATION OF CRANIAL NAVIGATION;  Surgeon: Judith Part, MD;  Location: [facility];  Service: Neurosurgery;  Laterality: N/A;  RM 19   CRANIOTOMY Left 02/13/2021   Procedure: Left Craniotomy for tumor resection with brainlab;  Surgeon: Judith Part, MD;  Location: [facility];  Service: Neurosurgery;  Laterality: Left;   Social History:  Social History   Socioeconomic History   Marital status: Married    Spouse name: Not on file   Number of children: Not on file   Years of education: Not on file   Highest education level: Not on file  Occupational History   Not on file  Tobacco Use   Smoking status: Never   Smokeless tobacco: Former    Types: Nurse, children's Use: Never used  Substance and Sexual Activity   Alcohol use: Yes    Comment: 3-4 x weekly- beers   Drug use: No   Sexual activity: Yes  Other Topics Concern   Not on file  Social History Narrative   Not on file   Social Determinants of Health   Financial Resource Strain: Not on file  Food Insecurity: Not on file  Transportation Needs: Not on file  Physical Activity: Not on file  Stress: Not on file  Social Connections: Not on file  Intimate Partner Violence: Not on file   Family History: No family history on file.  Review of Systems: Constitutional: Doesn't report fevers, chills or abnormal weight loss Eyes: Doesn't report blurriness of vision Ears, nose, mouth, throat, and face: Doesn't report sore throat Respiratory: Doesn't report  cough, dyspnea or wheezes Cardiovascular: Doesn't report palpitation, chest discomfort  Gastrointestinal:  Doesn't report nausea, constipation, diarrhea GU: Doesn't report incontinence Skin: Doesn't report skin rashes Neurological: Per HPI Musculoskeletal: Doesn't report joint pain Behavioral/Psych: Doesn't report anxiety  Physical Exam: There were no vitals filed for this visit.  KPS: 90. General: Alert, cooperative, pleasant, in no acute distress Head: Normal EENT: No conjunctival injection or scleral icterus.  Lungs: Resp effort normal Cardiac: Regular rate Abdomen: Non-distended abdomen Skin: No rashes cyanosis or petechiae. Extremities: No clubbing or edema  Neurologic Exam: Mental Status: Awake, alert, attentive to examiner. Oriented to self and environment. Language is fluent with intact comprehension.  Cranial Nerves: Visual acuity is grossly normal. Visual fields are full. Extra-ocular  movements intact. No ptosis. Face is symmetric Motor: Tone and bulk are normal. Power is full in both arms and legs. Reflexes are symmetric, no pathologic reflexes present.  Sensory: Intact to light touch Gait: Normal.   Labs: I have reviewed the data as listed    Component Value Date/Time   NA 140 10/26/2021 0959   NA 141 12/19/2018 1048   K 4.0 10/26/2021 0959   CL 102 10/26/2021 0959   CO2 31 10/26/2021 0959   GLUCOSE 98 10/26/2021 0959   BUN 10 10/26/2021 0959   BUN 15 12/19/2018 1048   CREATININE 0.99 10/26/2021 0959   CALCIUM 9.9 10/26/2021 0959   PROT 7.4 10/26/2021 0959   PROT 6.9 12/19/2018 1048   ALBUMIN 4.8 10/26/2021 0959   ALBUMIN 5.0 12/19/2018 1048   AST 24 10/26/2021 0959   ALT 45 (H) 10/26/2021 0959   ALKPHOS 88 10/26/2021 0959   BILITOT 0.6 10/26/2021 0959   GFRNONAA >60 10/26/2021 0959   GFRAA 102 12/19/2018 1048   Lab Results  Component Value Date   WBC 4.8 10/26/2021   NEUTROABS 2.9 10/26/2021   HGB 15.9 10/26/2021   HCT 46.1 10/26/2021   MCV  94.3 10/26/2021   PLT 217 10/26/2021   Imaging:  [facility] Clinician Interpretation: I have personally reviewed the CNS images as listed.  My interpretation, in the context of the patient's clinical presentation, is treatment effect vs true progression  MR Brain W Wo Contrast  Result Date: 01/06/2022 CLINICAL DATA:  Follow-up anaplastic oligodendroglioma treated with resection in 02/2021, radiation therapy from 03/2021-05/2021, and Temodar. EXAM: MRI HEAD WITHOUT AND WITH CONTRAST TECHNIQUE: Multiplanar, multiecho pulse sequences of the brain and surrounding structures were obtained without and with intravenous contrast. CONTRAST:  45m MULTIHANCE GADOBENATE DIMEGLUMINE 529 MG/ML IV SOLN COMPARISON:  Head MRI 11/27/2021 and 09/09/2021 FINDINGS: Brain: A resection cavity containing chronic blood products is again noted in the medial left parietal lobe. Cortical and subcortical T2/FLAIR hyperintensity surrounding the resection cavity is unchanged from 09/09/2021. Mild curvilinear intrinsic T1 hyperintensity and mild enhancement are again seen along the inferior margins of the resection cavity. A 3-4 mm focus of enhancement at the posteroinferior aspect of the cavity has minimally increased from 11/27/2021 and again has a nodular appearance on axial images (series 17, image 105) but has a more curvilinear appearance on coronal images (series 18, image 11). No new separate foci of abnormal enhancement are identified. There is no evidence of an acute infarct, hydrocephalus, midline shift, or extra-axial fluid collection. Vascular: Major intracranial vascular flow voids are preserved. Skull and upper cervical spine: Left parietal craniotomy. Sinuses/Orbits: Unremarkable orbits. Paranasal sinuses and mastoid air cells are clear. Other: None. IMPRESSION: Minimally increased size of enhancing focus at the posterior aspect of the left parietal resection cavity, indeterminate for post treatment changes or tumor. Unchanged  surrounding T2 signal abnormality. Electronically Signed   By: ALogan BoresM.D.   On: 01/06/2022 16:53     Assessment/Plan Anaplastic oligodendroglioma, IDH mutant and 1p/19q-codeleted ([place]  Focal seizures ([place]  JKONRAD HOAKis clinically stable today, now having completed cycle #6 of 5-day Temodar.  MRI brain again demonstrates an enhancing nodule posterior to the resection cavity, very subtly increased from prior study.  Etiology is either post-RT effects or early tumor recurrence.  We will continue to monitor with imaging, now off chemo.  Decadron is at 176mdaily.  Will con't Lamictal to 20058mID, he would prefer not to add a second AED at  this time.    Ativan 62m PR q6 PRN will also continue prolonged or clustered breakthrough seizures.   We ask that JARIYAN BRISENDINEreturn to clinic in 2 months following next brain MRI, or sooner as needed.  All questions were answered. The patient knows to call the clinic with any problems, questions or concerns. No barriers to learning were detected.  The total time spent in the encounter was 30 minutes and more than 50% was on counseling and review of test results   ZVentura Sellers MD Medical Director of Neuro-Oncology [facility]at WLyons07/10/23 9:26 AM

## 2022-01-12 ENCOUNTER — Telehealth: Payer: Self-pay | Admitting: Internal Medicine

## 2022-01-12 NOTE — Telephone Encounter (Signed)
Per 7/10 los called and spoke to pt about appointment  pt confirmed appointment

## 2022-01-15 ENCOUNTER — Other Ambulatory Visit (HOSPITAL_COMMUNITY): Payer: Self-pay

## 2022-01-25 ENCOUNTER — Other Ambulatory Visit: Payer: Self-pay

## 2022-02-02 ENCOUNTER — Other Ambulatory Visit: Payer: Self-pay

## 2022-02-23 ENCOUNTER — Other Ambulatory Visit: Payer: Self-pay | Admitting: Radiation Therapy

## 2022-02-24 ENCOUNTER — Other Ambulatory Visit: Payer: Self-pay

## 2022-03-11 ENCOUNTER — Other Ambulatory Visit (HOSPITAL_COMMUNITY): Payer: Self-pay

## 2022-03-12 ENCOUNTER — Other Ambulatory Visit (HOSPITAL_COMMUNITY): Payer: Self-pay

## 2022-03-12 ENCOUNTER — Ambulatory Visit (HOSPITAL_COMMUNITY)
Admission: RE | Admit: 2022-03-12 | Discharge: 2022-03-12 | Disposition: A | Discharge status: Home / Self Care | Payer: BC Managed Care – PPO | Source: Ambulatory Visit | Attending: Internal Medicine | Admitting: Internal Medicine

## 2022-03-12 DIAGNOSIS — C719 Malignant neoplasm of brain, unspecified: Secondary | ICD-10-CM | POA: Insufficient documentation

## 2022-03-12 MED ORDER — GADOBUTROL 1 MMOL/ML IV SOLN
10.0000 mL | Freq: Once | INTRAVENOUS | Status: AC | PRN
  Administered 2022-03-12: 10 mL via INTRAVENOUS

## 2022-03-15 ENCOUNTER — Inpatient Hospital Stay: Payer: BC Managed Care – PPO

## 2022-03-15 ENCOUNTER — Inpatient Hospital Stay: Payer: BC Managed Care – PPO | Attending: Internal Medicine | Admitting: Internal Medicine

## 2022-03-15 ENCOUNTER — Other Ambulatory Visit: Payer: Self-pay

## 2022-03-15 VITALS — BP 123/87 | HR 59 | Temp 97.9°F | Resp 18 | Ht 75.0 in | Wt 254.2 lb

## 2022-03-15 DIAGNOSIS — Z87891 Personal history of nicotine dependence: Secondary | ICD-10-CM | POA: Insufficient documentation

## 2022-03-15 DIAGNOSIS — R42 Dizziness and giddiness: Secondary | ICD-10-CM | POA: Diagnosis not present

## 2022-03-15 DIAGNOSIS — Z79899 Other long term (current) drug therapy: Secondary | ICD-10-CM | POA: Insufficient documentation

## 2022-03-15 DIAGNOSIS — C719 Malignant neoplasm of brain, unspecified: Secondary | ICD-10-CM

## 2022-03-15 DIAGNOSIS — R569 Unspecified convulsions: Secondary | ICD-10-CM

## 2022-03-15 DIAGNOSIS — C711 Malignant neoplasm of frontal lobe: Secondary | ICD-10-CM | POA: Insufficient documentation

## 2022-03-15 NOTE — Progress Notes (Signed)
Edwin Moreno at [place] Edwin Moreno, [place] [address] [phone]   Interval Evaluation  Date of Service: 03/15/22 Patient Name: Edwin Moreno Patient MRN: [id number] Patient DOB: Aug 25, 1975 Provider: [name], MD  Identifying Statement:  Edwin Moreno is a 46 y.o. 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 Zada Finders; path is WHO 3 oligodendroglioma, IDH-mt   03/23/2021 - 05/06/2021 Radiation Therapy   IMRT and daily Temodar 63m/m2 (Isidore Moos   06/14/2021 -  Chemotherapy   Patient is on Treatment Plan : BRAIN ANAPLASTIC GLIOMA GRADE III Temozolomide Post XRT q28d       Biomarkers:  MGMT Unknown.  IDH 1/2 Mutated.  1p/19q codeleted  TERT Unknown   Interval History: Edwin STOOKSBURYpresents today for follow up after recent MRI brain.  3 small seizure auras since prior visit 2 months ago, he is doing well dosing lamictal 2041mBID.  No recent changes with regards to right leg numbness.   H+P (03/02/21) Patient presented to medical attention this July with complaint of several years history of dizziness.  He described episodes of dizziness which would last for seconds to a minute, would come on "at any time".  They did/do not lead to any impairment or dyfunction.  Overall frequency had increased over the past months, to greater than once per week.  CNS imaging demonstrated an enhancing mass within the left frontal lobe, consistent with primary brain tumor.  He underwent craniotomy and resection with Dr. OsZada Findersn 02/13/21; path demonstrates high grade glioma with IDH mutation.  Following surgery, he has not complained of dizzy episodes, or  any neurologic issues.  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 (Needs to be seen before next refill) 30 tablet 0   Ascorbic Acid (VITAMIN C) 1000 MG tablet Take 1,000 mg by mouth daily. (Patient not taking: Reported on 04/16/2021)     cetirizine (ZYRTEC) 10 MG tablet Take 10 mg by mouth daily.     Cholecalciferol (VITAMIN D3) 50 MCG (2000 UT) TABS Take 2,000 Units by mouth daily.     Glucosamine HCl 1500 MG TABS Take 1,500 mg by mouth daily.     indomethacin (INDOCIN SR) 75 MG CR capsule Take 75 mg by mouth 2 (two) times daily with a meal.     lamoTRIgine (LAMICTAL) 200 MG tablet Take 1 tablet (200 mg total) by mouth 2 (two) times daily. 180 tablet 1   LORazepam (ATIVAN) 2 MG tablet TAKE ONE TABLET EVERY 6 HOURS AS NEEDED 30 tablet 0   Omega-3 Fatty Acids (FISH OIL) 1200 MG CAPS Take 1,200 mg by mouth daily.     omeprazole (PRILOSEC) 40 MG capsule TAKE (1) CAPSULE DAILY (Needs to be seen before next refill) (Patient taking differently: Take 40 mg by mouth daily.) 30 capsule 0   ondansetron (ZOFRAN) 8 MG tablet Take 1 tablet (8 mg total) by mouth 2 (two) times daily as needed (nausea and vomiting). May take 30-60 minutes prior to  Temodar administration if nausea/vomiting occurs. 30 tablet 1   OVER THE COUNTER MEDICATION Take 1 tablet by mouth daily. [name]     pravastatin (PRAVACHOL) 40 MG tablet TAKE (1/2) TABLET DAILY. (Needs to be seen before next refill) (Patient taking differently: Take 20 mg by mouth daily.) 90 tablet 1   prochlorperazine (COMPAZINE) 10 MG tablet Take 1 tablet (10 mg total) by mouth every 6 (six) hours as needed for nausea or vomiting. 30 tablet 1   zinc gluconate 50 MG tablet Take 50 mg by mouth daily.     No current facility-administered medications on  file prior to visit.    Allergies: No Known Allergies Past Medical History:  Past Medical History:  Diagnosis Date   Arthritis    Asthma    CHILDHOOD HX   GERD (gastroesophageal reflux disease)    Gout    H/O removal of cyst    hip as a child   Hyperlipidemia    Past Surgical History:  Past Surgical History:  Procedure Laterality Date   APPLICATION OF CRANIAL NAVIGATION N/A 02/13/2021   Procedure: APPLICATION OF CRANIAL NAVIGATION;  Surgeon: Judith Part, MD;  Location: [facility];  Service: Neurosurgery;  Laterality: N/A;  RM 19   CRANIOTOMY Left 02/13/2021   Procedure: Left Craniotomy for tumor resection with brainlab;  Surgeon: Judith Part, MD;  Location: [facility];  Service: Neurosurgery;  Laterality: Left;   Social History:  Social History   Socioeconomic History   Marital status: Married    Spouse name: Not on file   Number of children: Not on file   Years of education: Not on file   Highest education level: Not on file  Occupational History   Not on file  Tobacco Use   Smoking status: Never   Smokeless tobacco: Former    Types: Nurse, children's Use: Never used  Substance and Sexual Activity   Alcohol use: Yes    Comment: 3-4 x weekly- beers   Drug use: No   Sexual activity: Yes  Other Topics Concern   Not on file  Social History Narrative   Not on file   Social Determinants of Health   Financial Resource Strain: Not on file  Food Insecurity: Not on file  Transportation Needs: Not on file  Physical Activity: Not on file  Stress: Not on file  Social Connections: Not on file  Intimate Partner Violence: Not on file   Family History: No family history on file.  Review of Systems: Constitutional: Doesn't report fevers, chills or abnormal weight loss Eyes: Doesn't report blurriness of vision Ears, nose, mouth, throat, and face: Doesn't report sore throat Respiratory: Doesn't report cough, dyspnea or wheezes Cardiovascular: Doesn't  report palpitation, chest discomfort  Gastrointestinal:  Doesn't report nausea, constipation, diarrhea GU: Doesn't report incontinence Skin: Doesn't report skin rashes Neurological: Per HPI Musculoskeletal: Doesn't report joint pain Behavioral/Psych: Doesn't report anxiety  Physical Exam: Vitals:   03/15/22 0856  BP: 123/87  Pulse: (!) 59  Resp: 18  Temp: 97.9 F (36.6 C)  SpO2: 100%    KPS: 90. General: Alert, cooperative, pleasant, in no acute distress Head: Normal EENT: No conjunctival injection or scleral icterus.  Lungs: Resp effort normal Cardiac: Regular rate Abdomen: Non-distended abdomen Skin: No rashes cyanosis or petechiae. Extremities: No clubbing or edema  Neurologic Exam: Mental Status: Awake, alert, attentive to examiner. Oriented to self and environment. Language is fluent with intact comprehension.  Cranial  Nerves: Visual acuity is grossly normal. Visual fields are full. Extra-ocular movements intact. No ptosis. Face is symmetric Motor: Tone and bulk are normal. Power is full in both arms and legs. Reflexes are symmetric, no pathologic reflexes present.  Sensory: Intact to light touch Gait: Normal.   Labs: I have reviewed the data as listed    Component Value Date/Time   NA 141 01/11/2022 0923   NA 141 12/19/2018 1048   K 4.6 01/11/2022 0923   CL 105 01/11/2022 0923   CO2 30 01/11/2022 0923   GLUCOSE 91 01/11/2022 0923   BUN 18 01/11/2022 0923   BUN 15 12/19/2018 1048   CREATININE 1.01 01/11/2022 0923   CALCIUM 9.8 01/11/2022 0923   PROT 7.2 01/11/2022 0923   PROT 6.9 12/19/2018 1048   ALBUMIN 4.8 01/11/2022 0923   ALBUMIN 5.0 12/19/2018 1048   AST 23 01/11/2022 0923   ALT 34 01/11/2022 0923   ALKPHOS 79 01/11/2022 0923   BILITOT 0.8 01/11/2022 0923   GFRNONAA >60 01/11/2022 0923   GFRAA 102 12/19/2018 1048   Lab Results  Component Value Date   WBC 4.4 01/11/2022   NEUTROABS 3.0 01/11/2022   HGB 15.3 01/11/2022   HCT 43.6 01/11/2022    MCV 95.0 01/11/2022   PLT 198 01/11/2022   Imaging:  [place] Clinician Interpretation: I have personally reviewed the CNS images as listed.  My interpretation, in the context of the patient's clinical presentation, is treatment effect vs true progression  MR BRAIN W WO CONTRAST  Result Date: 03/14/2022 CLINICAL DATA:  Follow-up CNS neoplasm EXAM: MRI HEAD WITHOUT AND WITH CONTRAST TECHNIQUE: Multiplanar, multiecho pulse sequences of the brain and surrounding structures were obtained without and with intravenous contrast. CONTRAST:  64m GADAVIST GADOBUTROL 1 MMOL/ML IV SOLN COMPARISON:  01/06/2022 FINDINGS: Brain: Unchanged shape of the left parietal resection cavity with adjacent T2 hyperintensity and mild swelling extending inferiorly towards the left lateral ventricle. Mild nodular enhancement at the lower aspect of the cavity is less apparent than on prior. No restricted diffusion or adverse interval evolution. No new abnormality. Vascular: Major flow voids and vascular enhancements are preserved Skull and upper cervical spine: Normal marrow signal. Unremarkable left parietal craniotomy. Sinuses/Orbits: Negative IMPRESSION: No progressive T2 signal or enhancement at the left parietal resection cavity. Minimal enhancement is actually less conspicuous than 01/06/2022. Electronically Signed   By: JJorje GuildM.D.   On: 03/14/2022 10:06     Assessment/Plan Anaplastic oligodendroglioma, IDH mutant and 1p/19q-codeleted ([place]  Focal seizures ([place]  JLARELL BANEYis clinically stable today, having completed 6 cycles of 5-day Temodar.    MRI demonstrates stable findings with regards to enhancing nodule posterior to the resection cavity, suspected treatment effect. Will con't imaging surveillance off systemic therapy.  Will con't Lamictal 2054mBID.    Ativan 37m73mR q6 PRN will also continue prolonged or clustered breakthrough seizures.   We ask that JasSHAMERE CAMPASturn to clinic in 3 months  following next brain MRI, or sooner as needed.  All questions were answered. The patient knows to call the clinic with any problems, questions or concerns. No barriers to learning were detected.  The total time spent in the encounter was 30 minutes and more than 50% was on counseling and review of test results   ZacVentura SellersD Medical Director of Neuro-Oncology [facility] [place]/11/23 8:48 AM

## 2022-03-16 ENCOUNTER — Other Ambulatory Visit: Payer: Self-pay | Admitting: Radiation Therapy

## 2022-03-17 ENCOUNTER — Other Ambulatory Visit: Payer: Self-pay

## 2022-03-24 ENCOUNTER — Other Ambulatory Visit: Payer: Self-pay

## 2022-04-30 ENCOUNTER — Other Ambulatory Visit: Payer: Self-pay | Admitting: Internal Medicine

## 2022-04-30 DIAGNOSIS — C719 Malignant neoplasm of brain, unspecified: Secondary | ICD-10-CM

## 2022-06-17 ENCOUNTER — Ambulatory Visit (HOSPITAL_COMMUNITY)
Admission: RE | Admit: 2022-06-17 | Discharge: 2022-06-17 | Disposition: A | Discharge status: Home / Self Care | Payer: BC Managed Care – PPO | Source: Ambulatory Visit | Attending: Internal Medicine | Admitting: Internal Medicine

## 2022-06-17 DIAGNOSIS — C719 Malignant neoplasm of brain, unspecified: Secondary | ICD-10-CM | POA: Diagnosis present

## 2022-06-17 MED ORDER — GADOBUTROL 1 MMOL/ML IV SOLN
10.0000 mL | Freq: Once | INTRAVENOUS | Status: AC | PRN
  Administered 2022-06-17: 10 mL via INTRAVENOUS

## 2022-06-21 ENCOUNTER — Inpatient Hospital Stay: Payer: BC Managed Care – PPO | Attending: Internal Medicine | Admitting: Internal Medicine

## 2022-06-21 ENCOUNTER — Inpatient Hospital Stay: Payer: BC Managed Care – PPO

## 2022-06-21 ENCOUNTER — Other Ambulatory Visit: Payer: Self-pay

## 2022-06-21 VITALS — BP 139/94 | HR 60 | Temp 97.9°F | Resp 18 | Ht 75.0 in | Wt 256.0 lb

## 2022-06-21 DIAGNOSIS — G4089 Other seizures: Secondary | ICD-10-CM | POA: Diagnosis not present

## 2022-06-21 DIAGNOSIS — C711 Malignant neoplasm of frontal lobe: Secondary | ICD-10-CM | POA: Insufficient documentation

## 2022-06-21 DIAGNOSIS — Z87891 Personal history of nicotine dependence: Secondary | ICD-10-CM | POA: Diagnosis not present

## 2022-06-21 DIAGNOSIS — C719 Malignant neoplasm of brain, unspecified: Secondary | ICD-10-CM

## 2022-06-21 DIAGNOSIS — R253 Fasciculation: Secondary | ICD-10-CM | POA: Insufficient documentation

## 2022-06-21 DIAGNOSIS — Z79899 Other long term (current) drug therapy: Secondary | ICD-10-CM | POA: Insufficient documentation

## 2022-06-21 DIAGNOSIS — R569 Unspecified convulsions: Secondary | ICD-10-CM

## 2022-06-21 MED ORDER — LACOSAMIDE 50 MG PO TABS
50.0000 mg | ORAL_TABLET | Freq: Two times a day (BID) | ORAL | 2 refills | Status: DC
Start: 2022-06-21 — End: 2023-01-20

## 2022-06-21 NOTE — Progress Notes (Signed)
[facility] at [place], [place] [address] [phone]   Interval Evaluation  Date of Service: 06/21/22 Patient Name: Edwin Moreno Patient MRN: [id number] Patient DOB: [birth year] Provider: [name], MD  Identifying Statement:  Edwin Moreno is a 46 y.o. 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 Zada Finders; path is WHO 3 oligodendroglioma, IDH-mt   03/23/2021 - 05/06/2021 Radiation Therapy   IMRT and daily Temodar 5m/m2 (Edwin Moreno   06/14/2021 -  Chemotherapy   Patient is on Treatment Plan : BRAIN ANAPLASTIC GLIOMA GRADE III Temozolomide Post XRT q28d       Biomarkers:  MGMT Unknown.  IDH 1/2 Mutated.  1p/19q codeleted  TERT Unknown   Interval History: Edwin SZABOpresents today for follow up after recent MRI brain.  Continues to have more than once per month seizures auras, one episode involving some twitching of his right side.  He is doing well dosing lamictal 205mBID.  No recent changes with regards to right leg numbness.   H+P (03/02/21) Patient presented to medical attention this July with complaint of several years history of dizziness.  He described episodes of dizziness which would last for seconds to a minute, would come on "at any time".  They did/do not lead to any impairment or dyfunction.  Overall frequency had increased over the past months, to greater than once per week.  CNS imaging demonstrated an enhancing mass within the left frontal lobe, consistent with primary brain tumor.  He underwent craniotomy and resection with Dr. OsZada Findersn 02/13/21; path demonstrates high grade glioma with IDH mutation.   Following surgery, he has not complained of dizzy episodes, or any neurologic issues.  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 (Needs to be seen before next refill) 30 tablet 0   Ascorbic Acid (VITAMIN C) 1000 MG tablet Take 1,000 mg by mouth daily.     cetirizine (ZYRTEC) 10 MG tablet Take 10 mg by mouth daily.     Cholecalciferol (VITAMIN D3) 50 MCG (2000 UT) TABS Take 2,000 Units by mouth daily.     Glucosamine HCl 1500 MG TABS Take 1,500 mg by mouth daily.     indomethacin (INDOCIN SR) 75 MG CR capsule Take 75 mg by mouth 2 (two) times daily with a meal.     lamoTRIgine (LAMICTAL) 200 MG tablet Take 1 tablet (200 mg total) by mouth 2 (two) times daily. 180 tablet 1   LORazepam (ATIVAN) 2 MG tablet TAKE ONE TABLET EVERY 6 HOURS AS NEEDED 30 tablet 0   Omega-3 Fatty Acids (FISH OIL) 1200 MG CAPS Take 1,200 mg by mouth daily.     omeprazole (PRILOSEC) 40 MG capsule TAKE (1) CAPSULE DAILY (Needs to be seen before next refill) (Patient taking differently: Take 40 mg by mouth daily.) 30 capsule 0   ondansetron (ZOFRAN) 8 MG tablet Take 1 tablet (8 mg total) by mouth 2 (two) times daily as needed (nausea and vomiting). May take  30-60 minutes prior to Temodar administration if nausea/vomiting occurs. 30 tablet 1   OVER THE COUNTER MEDICATION Take 1 tablet by mouth daily. [place]     pravastatin (PRAVACHOL) 40 MG tablet TAKE (1/2) TABLET DAILY. (Needs to be seen before next refill) (Patient taking differently: Take 20 mg by mouth daily.) 90 tablet 1   prochlorperazine (COMPAZINE) 10 MG tablet Take 1 tablet (10 mg total) by mouth every 6 (six) hours as needed for nausea or vomiting. 30 tablet 1   zinc gluconate 50 MG tablet Take 50 mg by mouth daily.     No current  facility-administered medications on file prior to visit.    Allergies: No Known Allergies Past Medical History:  Past Medical History:  Diagnosis Date   Arthritis    Asthma    CHILDHOOD HX   GERD (gastroesophageal reflux disease)    Gout    H/O removal of cyst    hip as a child   Hyperlipidemia    Past Surgical History:  Past Surgical History:  Procedure Laterality Date   APPLICATION OF CRANIAL NAVIGATION N/A 02/13/2021   Procedure: APPLICATION OF CRANIAL NAVIGATION;  Surgeon: Judith Part, MD;  Location: [facility];  Service: Neurosurgery;  Laterality: N/A;  RM 19   CRANIOTOMY Left 02/13/2021   Procedure: Left Craniotomy for tumor resection with brainlab;  Surgeon: Judith Part, MD;  Location: [facility];  Service: Neurosurgery;  Laterality: Left;   Social History:  Social History   Socioeconomic History   Marital status: Married    Spouse name: Not on file   Number of children: Not on file   Years of education: Not on file   Highest education level: Not on file  Occupational History   Not on file  Tobacco Use   Smoking status: Never   Smokeless tobacco: Former    Types: Nurse, children's Use: Never used  Substance and Sexual Activity   Alcohol use: Yes    Comment: 3-4 x weekly- beers   Drug use: No   Sexual activity: Yes  Other Topics Concern   Not on file  Social History Narrative   Not on file   Social Determinants of Health   Financial Resource Strain: Not on file  Food Insecurity: Not on file  Transportation Needs: Not on file  Physical Activity: Not on file  Stress: Not on file  Social Connections: Not on file  Intimate Partner Violence: Not on file   Family History: No family history on file.  Review of Systems: Constitutional: Doesn't report fevers, chills or abnormal weight loss Eyes: Doesn't report blurriness of vision Ears, nose, mouth, throat, and face: Doesn't report sore throat Respiratory: Doesn't report cough, dyspnea or  wheezes Cardiovascular: Doesn't report palpitation, chest discomfort  Gastrointestinal:  Doesn't report nausea, constipation, diarrhea GU: Doesn't report incontinence Skin: Doesn't report skin rashes Neurological: Per HPI Musculoskeletal: Doesn't report joint pain Behavioral/Psych: Doesn't report anxiety  Physical Exam: Vitals:   06/21/22 1001  BP: (!) 139/94  Pulse: 60  Resp: 18  Temp: 97.9 F (36.6 C)  SpO2: 100%   KPS: 90. General: Alert, cooperative, pleasant, in no acute distress Head: Normal EENT: No conjunctival injection or scleral icterus.  Lungs: Resp effort normal Cardiac: Regular rate Abdomen: Non-distended abdomen Skin: No rashes cyanosis or petechiae. Extremities: No clubbing or edema  Neurologic Exam: Mental Status: Awake, alert, attentive to examiner. Oriented to self and environment. Language is fluent with intact  comprehension.  Cranial Nerves: Visual acuity is grossly normal. Visual fields are full. Extra-ocular movements intact. No ptosis. Face is symmetric Motor: Tone and bulk are normal. Power is full in both arms and legs. Reflexes are symmetric, no pathologic reflexes present.  Sensory: Intact to light touch Gait: Normal.   Labs: I have reviewed the data as listed    Component Value Date/Time   NA 141 01/11/2022 0923   NA 141 12/19/2018 1048   K 4.6 01/11/2022 0923   CL 105 01/11/2022 0923   CO2 30 01/11/2022 0923   GLUCOSE 91 01/11/2022 0923   BUN 18 01/11/2022 0923   BUN 15 12/19/2018 1048   CREATININE 1.01 01/11/2022 0923   CALCIUM 9.8 01/11/2022 0923   PROT 7.2 01/11/2022 0923   PROT 6.9 12/19/2018 1048   ALBUMIN 4.8 01/11/2022 0923   ALBUMIN 5.0 12/19/2018 1048   AST 23 01/11/2022 0923   ALT 34 01/11/2022 0923   ALKPHOS 79 01/11/2022 0923   BILITOT 0.8 01/11/2022 0923   GFRNONAA >60 01/11/2022 0923   GFRAA 102 12/19/2018 1048   Lab Results  Component Value Date   WBC 4.4 01/11/2022   NEUTROABS 3.0 01/11/2022   HGB 15.3  01/11/2022   HCT 43.6 01/11/2022   MCV 95.0 01/11/2022   PLT 198 01/11/2022   Imaging:  [place] Clinician Interpretation: I have personally reviewed the CNS images as listed.  My interpretation, in the context of the patient's clinical presentation, is treatment effect vs true progression  MR BRAIN W WO CONTRAST  Result Date: 06/17/2022 CLINICAL DATA:  Follow-up anaplastic oligodendroglioma. IDH mutant. Assess response to treatment. EXAM: MRI HEAD WITHOUT AND WITH CONTRAST TECHNIQUE: Multiplanar, multiecho pulse sequences of the brain and surrounding structures were obtained without and with intravenous contrast. CONTRAST:  16m GADAVIST GADOBUTROL 1 MMOL/ML IV SOLN COMPARISON:  MRI head 03/14/2022 FINDINGS: Brain: Left parietal craniotomy for tumor resection in the posterior left parietal lobe. Resection cavity is stable. Associated T2 and FLAIR hyperintensity surrounding the cavity is stable. Interval improvement in small area of enhancement along the deep margin of the cavity. No recurrent mass or nodular enhancement. No progressive edema. Mild chronic hemosiderin in the surgical cavity unchanged. Ventricle size normal.  No acute infarct. Vascular: Normal arterial flow voids Skull and upper cervical spine: Left parietal craniotomy. Sinuses/Orbits: Paranasal sinuses clear. Negative orbit Other: None IMPRESSION: No evidence of recurrent tumor. Stable postop change on the left with interval improvement in small area of enhancement along the deep margin of the surgical cavity. Electronically Signed   By: CFranchot GalloM.D.   On: 06/17/2022 16:15     Assessment/Plan Anaplastic oligodendroglioma, IDH mutant and 1p/19q-codeleted ([place]  Focal seizures ([place]  Edwin DANTESis clinically stable today, having completed 6 cycles of 5-day Temodar.    MRI demonstrates stable findings with regards to enhancing nodule posterior to the resection cavity, suspected treatment effect.  Will con't imaging  surveillance off systemic therapy.  Will con't Lamictal 2028mBID; recommended adding Vimpat 5092mID for additional coverage given ongoing epileptic events.  Ativan 2mg32m q6 PRN will also continue prolonged or clustered breakthrough seizures.   We ask that Edwin SCHILLERurn to clinic in 3 months following next brain MRI, or sooner as needed.  All questions were answered. The patient knows to call the clinic with any problems, questions or concerns. No barriers to learning were detected.  The total time spent in the encounter was 30 minutes and more than  50% was on counseling and review of test results   [name], MD Medical Director of Neuro-Oncology [facility] at [place] 06/21/22 10:02 AM

## 2022-06-22 ENCOUNTER — Other Ambulatory Visit: Payer: Self-pay | Admitting: Internal Medicine

## 2022-06-22 ENCOUNTER — Telehealth: Payer: Self-pay | Admitting: Internal Medicine

## 2022-06-22 NOTE — Telephone Encounter (Signed)
Called patient to schedule f/u. Patient notified of new appointment.

## 2022-08-10 ENCOUNTER — Other Ambulatory Visit: Payer: Self-pay | Admitting: Internal Medicine

## 2022-08-30 ENCOUNTER — Other Ambulatory Visit: Payer: Self-pay | Admitting: Radiation Therapy

## 2022-09-16 ENCOUNTER — Ambulatory Visit (HOSPITAL_COMMUNITY): Admission: RE | Admit: 2022-09-16 | Payer: BC Managed Care – PPO | Source: Ambulatory Visit

## 2022-09-16 ENCOUNTER — Ambulatory Visit (HOSPITAL_COMMUNITY)
Admission: RE | Admit: 2022-09-16 | Discharge: 2022-09-16 | Disposition: A | Discharge status: Home / Self Care | Payer: BC Managed Care – PPO | Source: Ambulatory Visit | Attending: Internal Medicine | Admitting: Internal Medicine

## 2022-09-16 DIAGNOSIS — C719 Malignant neoplasm of brain, unspecified: Secondary | ICD-10-CM | POA: Diagnosis present

## 2022-09-16 MED ORDER — GADOBUTROL 1 MMOL/ML IV SOLN
10.0000 mL | Freq: Once | INTRAVENOUS | Status: AC | PRN
  Administered 2022-09-16: 10 mL via INTRAVENOUS

## 2022-09-20 ENCOUNTER — Inpatient Hospital Stay: Payer: BC Managed Care – PPO

## 2022-09-20 ENCOUNTER — Inpatient Hospital Stay: Payer: BC Managed Care – PPO | Attending: Internal Medicine | Admitting: Internal Medicine

## 2022-09-20 VITALS — BP 130/91 | HR 57 | Temp 97.7°F | Resp 20 | Wt 255.7 lb

## 2022-09-20 DIAGNOSIS — G4089 Other seizures: Secondary | ICD-10-CM | POA: Insufficient documentation

## 2022-09-20 DIAGNOSIS — C719 Malignant neoplasm of brain, unspecified: Secondary | ICD-10-CM | POA: Diagnosis not present

## 2022-09-20 DIAGNOSIS — R253 Fasciculation: Secondary | ICD-10-CM | POA: Diagnosis not present

## 2022-09-20 DIAGNOSIS — Z79899 Other long term (current) drug therapy: Secondary | ICD-10-CM | POA: Diagnosis not present

## 2022-09-20 DIAGNOSIS — C711 Malignant neoplasm of frontal lobe: Secondary | ICD-10-CM | POA: Diagnosis present

## 2022-09-20 DIAGNOSIS — R42 Dizziness and giddiness: Secondary | ICD-10-CM | POA: Diagnosis not present

## 2022-09-20 DIAGNOSIS — R569 Unspecified convulsions: Secondary | ICD-10-CM

## 2022-09-20 NOTE — Progress Notes (Signed)
[facility] [place], [place] [address] [phone]   Interval Evaluation  Date of Service: 09/20/22 Patient Name: Edwin Moreno Patient MRN: [id number] Patient DOB: [birth year] Provider: [name], MD  Identifying Statement:  Edwin Moreno is a 47 y.o. 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 Zada Finders; path is WHO 3 oligodendroglioma, IDH-mt   03/23/2021 - 05/06/2021 Radiation Therapy   IMRT and daily Temodar 75mg /m2 [name]   06/14/2021 - 06/14/2021 Chemotherapy   Patient is on Treatment Plan : BRAIN ANAPLASTIC GLIOMA GRADE III Temozolomide Post XRT q28d       Biomarkers:  MGMT Unknown.  IDH 1/2 Mutated.  1p/19q codeleted  TERT Unknown   Interval History: Edwin Moreno presents today for follow up after recent MRI brain.  Continues to have occasional seizure auras, right sided twitching.  He is doing well dosing lamictal 200mg  BID.  No recent changes with regards to right leg numbness.   H+P (03/02/21) Patient presented to medical attention this July with complaint of several years history of dizziness.  He described episodes of dizziness which would last for seconds to a minute, would come on "at any time".  They did/do not lead to any impairment or dyfunction.  Overall frequency had increased over the past months, to greater than once per week.  CNS imaging demonstrated an enhancing mass within the left frontal lobe, consistent with primary brain tumor.  He underwent craniotomy and resection with Dr. Zada Finders on 02/13/21; path demonstrates high grade glioma with IDH mutation.  Following surgery, he has not complained  of dizzy episodes, or any neurologic issues.  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  Medications: Current Outpatient Medications on File Prior to Visit  Medication Sig Dispense Refill   allopurinol (ZYLOPRIM) 300 MG tablet Take 1 tablet (300 mg total) by mouth daily. (Needs to be seen before next refill) 30 tablet 0   Ascorbic Acid (VITAMIN C) 1000 MG tablet Take 1,000 mg by mouth daily.     cetirizine (ZYRTEC) 10 MG tablet Take 10 mg by mouth daily.     Cholecalciferol (VITAMIN D3) 50 MCG (2000 UT) TABS Take 2,000 Units by mouth daily.     Glucosamine HCl 1500 MG TABS Take 1,500 mg by mouth daily.     indomethacin (INDOCIN SR) 75 MG CR capsule Take 75 mg by mouth 2 (two) times daily with a meal.     lamoTRIgine (LAMICTAL) 200 MG tablet TAKE ONE TABLET TWICE DAILY 180 tablet 1   Omega-3 Fatty Acids (FISH OIL) 1200 MG CAPS Take 1,200 mg by mouth daily.     omeprazole (PRILOSEC) 40 MG capsule TAKE (1) CAPSULE DAILY (Needs to be seen before next refill) (Patient taking differently: Take 40 mg by mouth daily.) 30 capsule 0   pravastatin (PRAVACHOL) 40 MG tablet TAKE (1/2) TABLET DAILY. (Needs to be seen before next refill) (Patient taking differently: Take 20 mg by mouth daily.) 90 tablet 1   zinc gluconate 50 MG tablet Take 50 mg by mouth daily.     lacosamide (VIMPAT) 50 MG TABS tablet Take 1 tablet (50 mg total) by mouth  2 (two) times daily. 60 tablet 2   prochlorperazine (COMPAZINE) 10 MG tablet Take 1 tablet (10 mg total) by mouth every 6 (six) hours as needed for nausea or vomiting. 30 tablet 1   No current facility-administered medications on file prior to visit.    Allergies: No Known Allergies Past Medical History:  Past Medical History:  Diagnosis Date   Arthritis    Asthma    CHILDHOOD HX   GERD (gastroesophageal reflux disease)    Gout    H/O removal of cyst    hip as a child   Hyperlipidemia    Past Surgical History:  Past Surgical History:   Procedure Laterality Date   APPLICATION OF CRANIAL NAVIGATION N/A 02/13/2021   Procedure: APPLICATION OF CRANIAL NAVIGATION;  Surgeon: Judith Part, MD;  Location: [facility];  Service: Neurosurgery;  Laterality: N/A;  RM 19   CRANIOTOMY Left 02/13/2021   Procedure: Left Craniotomy for tumor resection with brainlab;  Surgeon: Judith Part, MD;  Location: [facility];  Service: Neurosurgery;  Laterality: Left;   Social History:  Social History   Socioeconomic History   Marital status: Married    Spouse name: Not on file   Number of children: Not on file   Years of education: Not on file   Highest education level: Not on file  Occupational History   Not on file  Tobacco Use   Smoking status: Never   Smokeless tobacco: Former    Types: Nurse, children's Use: Never used  Substance and Sexual Activity   Alcohol use: Yes    Comment: 3-4 x weekly- beers   Drug use: No   Sexual activity: Yes  Other Topics Concern   Not on file  Social History Narrative   Not on file   Social Determinants of Health   Financial Resource Strain: Not on file  Food Insecurity: Not on file  Transportation Needs: Not on file  Physical Activity: Not on file  Stress: Not on file  Social Connections: Not on file  Intimate Partner Violence: Not on file   Family History: No family history on file.  Review of Systems: Constitutional: Doesn't report fevers, chills or abnormal weight loss Eyes: Doesn't report blurriness of vision Ears, nose, mouth, throat, and face: Doesn't report sore throat Respiratory: Doesn't report cough, dyspnea or wheezes Cardiovascular: Doesn't report palpitation, chest discomfort  Gastrointestinal:  Doesn't report nausea, constipation, diarrhea GU: Doesn't report incontinence Skin: Doesn't report skin rashes Neurological: Per HPI Musculoskeletal: Doesn't report joint pain Behavioral/Psych: Doesn't report anxiety  Physical Exam: Vitals:   09/20/22 0957  BP:  (!) 130/91  Pulse: (!) 57  Resp: 20  Temp: 97.7 F (36.5 C)  SpO2: 100%   KPS: 90. General: Alert, cooperative, pleasant, in no acute distress Head: Normal EENT: No conjunctival injection or scleral icterus.  Lungs: Resp effort normal Cardiac: Regular rate Abdomen: Non-distended abdomen Skin: No rashes cyanosis or petechiae. Extremities: No clubbing or edema  Neurologic Exam: Mental Status: Awake, alert, attentive to examiner. Oriented to self and environment. Language is fluent with intact comprehension.  Cranial Nerves: Visual acuity is grossly normal. Visual fields are full. Extra-ocular movements intact. No ptosis. Face is symmetric Motor: Tone and bulk are normal. Power is full in both arms and legs. Reflexes are symmetric, no pathologic reflexes present.  Sensory: Intact to light touch Gait: Normal.   Labs: I have reviewed the data as listed    Component Value Date/Time  NA 141 01/11/2022 0923   NA 141 12/19/2018 1048   K 4.6 01/11/2022 0923   CL 105 01/11/2022 0923   CO2 30 01/11/2022 0923   GLUCOSE 91 01/11/2022 0923   BUN 18 01/11/2022 0923   BUN 15 12/19/2018 1048   CREATININE 1.01 01/11/2022 0923   CALCIUM 9.8 01/11/2022 0923   PROT 7.2 01/11/2022 0923   PROT 6.9 12/19/2018 1048   ALBUMIN 4.8 01/11/2022 0923   ALBUMIN 5.0 12/19/2018 1048   AST 23 01/11/2022 0923   ALT 34 01/11/2022 0923   ALKPHOS 79 01/11/2022 0923   BILITOT 0.8 01/11/2022 0923   GFRNONAA >60 01/11/2022 0923   GFRAA 102 12/19/2018 1048   Lab Results  Component Value Date   WBC 4.4 01/11/2022   NEUTROABS 3.0 01/11/2022   HGB 15.3 01/11/2022   HCT 43.6 01/11/2022   MCV 95.0 01/11/2022   PLT 198 01/11/2022   Imaging:  [facility] Clinician Interpretation: I have personally reviewed the CNS images as listed.  My interpretation, in the context of the patient's clinical presentation, is stable disease  MR BRAIN W WO CONTRAST  Result Date: 09/20/2022 CLINICAL DATA:  Brain/CNS neoplasm,  assess treatment response. Anaplastic oligodendroglioma, IDH mutant and 1p/19q codeleted. EXAM: MRI HEAD WITHOUT AND WITH CONTRAST TECHNIQUE: Multiplanar, multiecho pulse sequences of the brain and surrounding structures were obtained without and with intravenous contrast. CONTRAST:  79mL GADAVIST GADOBUTROL 1 MMOL/ML IV SOLN COMPARISON:  MRI brain 06/17/2022 and older. FINDINGS: Brain: Stable postoperative changes of left vertex craniotomy with underlying resection cavity in the left paracentral lobule. Unchanged surrounding T2 hyperintense signal abnormality. No new or nodular enhancement. No acute infarct or intracranial hemorrhage. No mass effect or midline shift. No hydrocephalus or extra-axial collection. Vascular: Normal flow voids. Skull and upper cervical spine: Prior left vertex craniotomy. Otherwise normal marrow signal and enhancement. Sinuses/Orbits: Unremarkable. Other: None. IMPRESSION: Stable exam. No evidence of disease progression. Electronically Signed   By: Emmit Alexanders M.D.   On: 09/20/2022 10:39     Assessment/Plan Anaplastic oligodendroglioma, IDH mutant and 1p/19q-codeleted ([facility])  Focal seizures ([facility])  Edwin Moreno is clinically stable today, no new or progressive changes.  MRI demonstrates stable findings with regards to enhancing nodule posterior to the resection cavity, suspected treatment effect.  Will con't imaging surveillance off systemic therapy.  Will con't Lamictal 200mg  BID, Vimpat 50mg  BID.  Ativan 2mg  PR q6 PRN will also continue prolonged or clustered breakthrough seizures.   We ask that Edwin Moreno return to clinic in 4 months following next brain MRI, or sooner as needed.  All questions were answered. The patient knows to call the clinic with any problems, questions or concerns. No barriers to learning were detected.  The total time spent in the encounter was 30 minutes and more than 50% was on counseling and review of test results   [name], MD Medical Director of Neuro-Oncology [facility] at [place] 09/20/22 10:09 AM

## 2022-09-21 ENCOUNTER — Other Ambulatory Visit: Payer: Self-pay | Admitting: Radiation Therapy

## 2022-09-27 ENCOUNTER — Telehealth: Payer: Self-pay | Admitting: *Deleted

## 2022-09-27 NOTE — Telephone Encounter (Signed)
error 

## 2022-10-14 IMAGING — MR MR HEAD WO/W CM
14 series · 48 of 48 positions shown · IV contrast (20 ml multihance)
Comparison: None.

CLINICAL DATA: Dizziness and giddiness. Arm tremors and unsteady
gait for 1 year.

EXAM:
MRI HEAD WITHOUT AND WITH CONTRAST
TECHNIQUE: Multiplanar, multiecho pulse sequences of the brain and surrounding
structures were obtained without and with intravenous contrast.
CONTRAST:  20mL MULTIHANCE GADOBENATE DIMEGLUMINE 529 MG/ML IV SOLN

[Series 5: T1 · sagittal · 4.0mm · 0.75mm/px · 1 of 31 slices shown (1 of 3)]
[im 1/31]
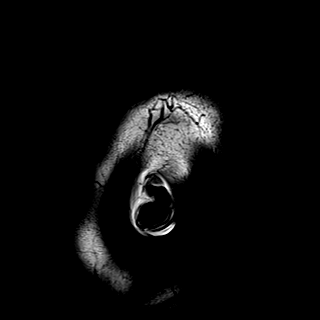

[Series 6: DWI · axial · 3.0mm · 0.94mm/px · z∈[-34,+113]mm · 8 of 168 slices shown (1 of 3)]
[im 1/168]
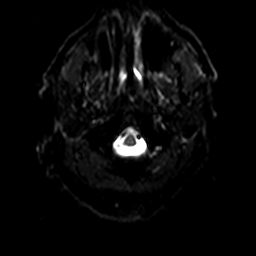
[im 24/168]
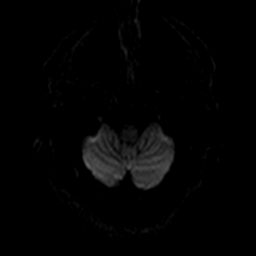
[im 48/168]
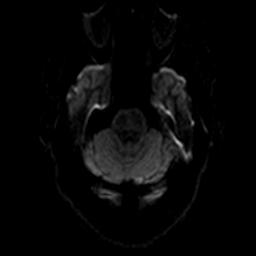
[im 72/168]
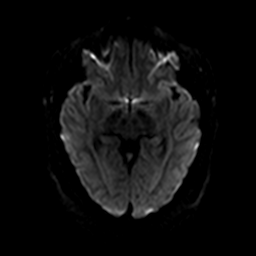
[im 96/168]
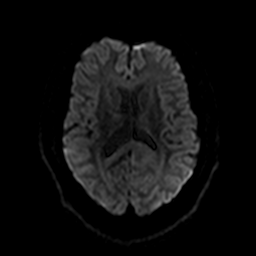
[im 120/168]
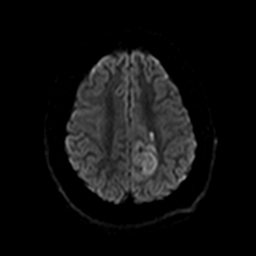
[im 144/168]
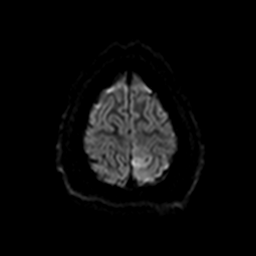
[im 168/168]
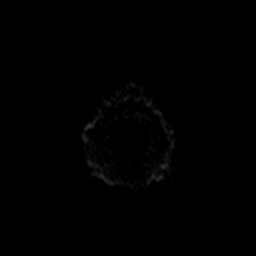

[Series 7: ax dwi_tracew · axial · 3.0mm · 0.94mm/px · z∈[-34,+113]mm · 4 of 84 slices shown]
[im 1/84]
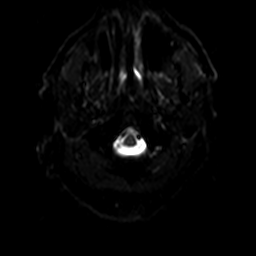
[im 28/84]
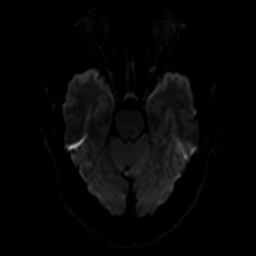
[im 56/84]
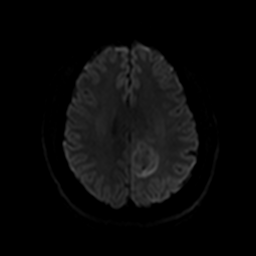
[im 84/84]
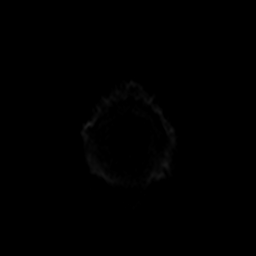

[Series 8: ax dwi_adc · axial · 3.0mm · 0.94mm/px · z∈[-34,+113]mm · 2 of 42 slices shown]
[im 1/42]
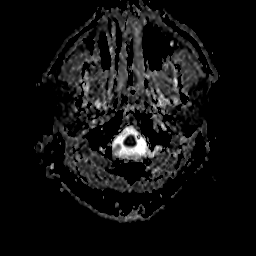
[im 42/42]
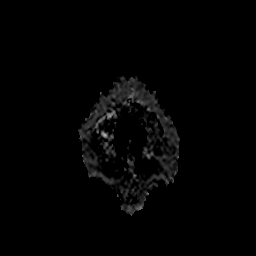

[Series 9: DWI · coronal · 5.0mm · 1.44mm/px · 3 of 64 slices shown (2 of 3)]
[im 1/64]
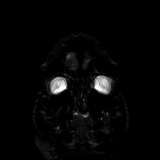
[im 32/64]
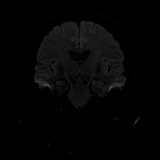
[im 64/64]
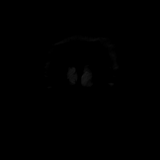

[Series 10: DWI · coronal · 5.0mm · 1.44mm/px · 2 of 32 slices shown (3 of 3)]
[im 1/32]
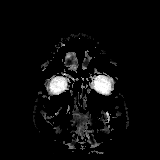
[im 32/32]
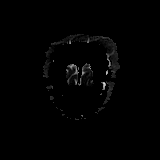

[Series 11: T2 · axial · 4.0mm · 0.36mm/px · 1 of 29 slices shown]
[im 1/29]
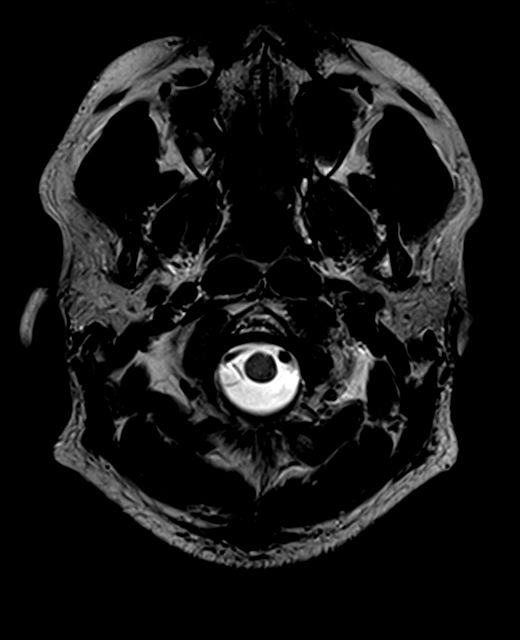

[Series 12: FLAIR · axial · 3.0mm · 0.72mm/px · 1 of 26 slices shown]
[im 1/26]
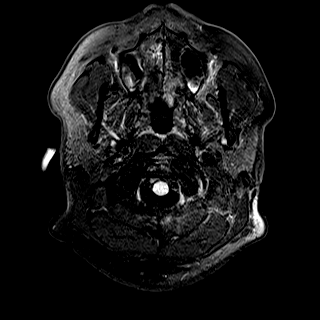

[Series 13: swi_images · axial · 1.5mm · 0.90mm/px · z∈[-36,+106]mm · 5 of 96 slices shown]
[im 1/96]
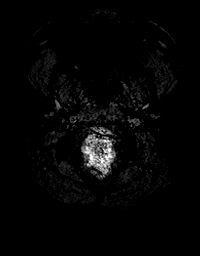
[im 24/96]
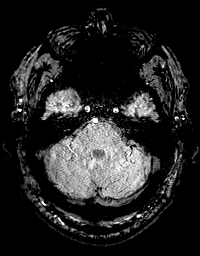
[im 48/96]
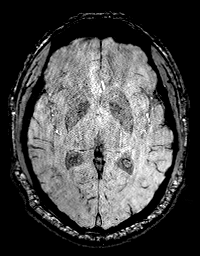
[im 72/96]
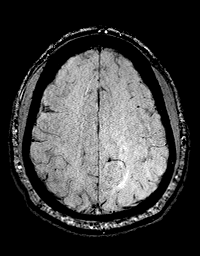
[im 96/96]
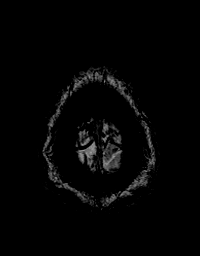

[Series 15: T1 · axial · 1.0mm · 0.94mm/px · z∈[-54,+105]mm · 8 of 160 slices shown (2 of 3)]
[im 1/160]
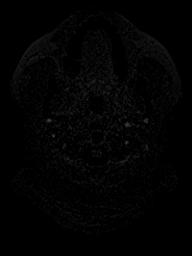
[im 23/160]
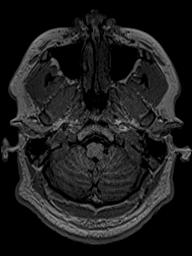
[im 46/160]
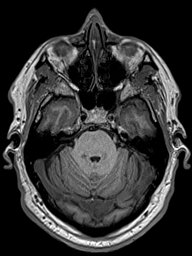
[im 69/160]
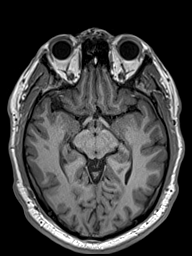
[im 91/160]
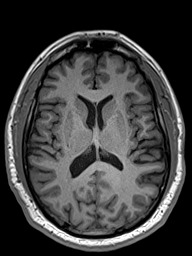
[im 114/160]
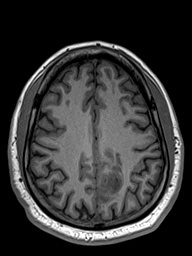
[im 137/160]
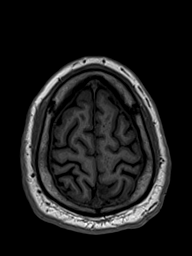
[im 160/160]
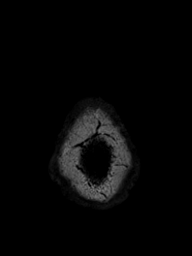

[Series 16: T2 post-contrast · coronal · 4.5mm · 0.36mm/px · 2 of 35 slices shown]
[im 1/35]
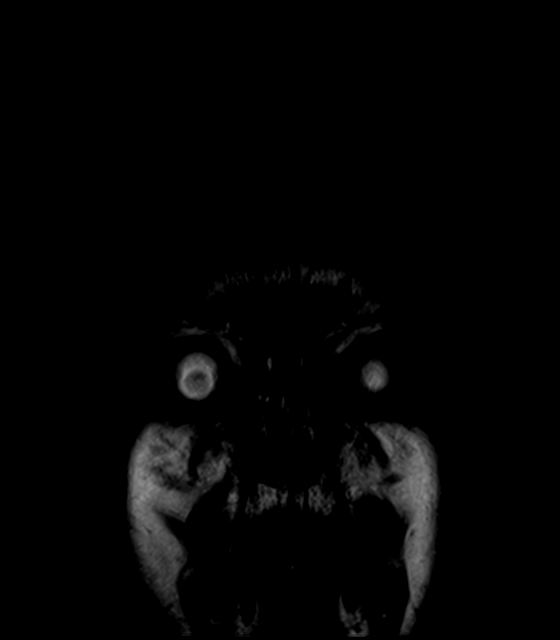
[im 35/35]
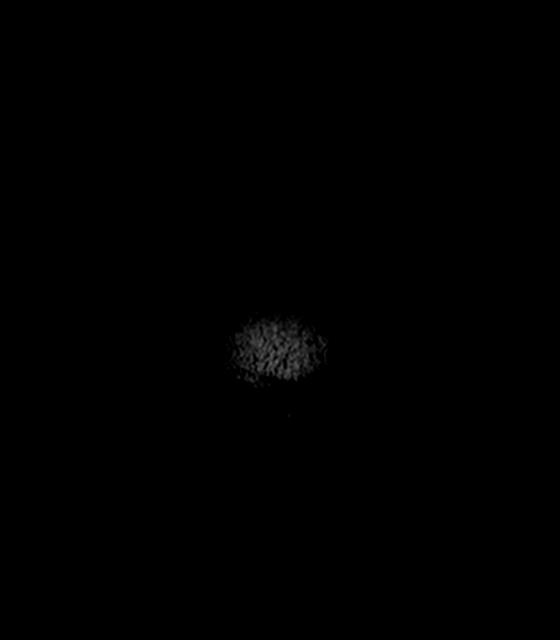

[Series 17: T1 · axial · 1.0mm · 0.94mm/px · z∈[-54,+105]mm · 8 of 160 slices shown (3 of 3)]
[im 1/160]
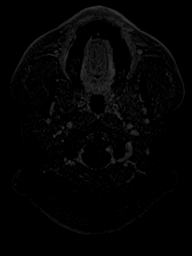
[im 23/160]
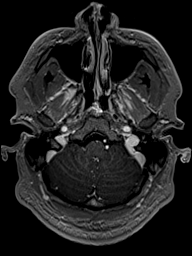
[im 46/160]
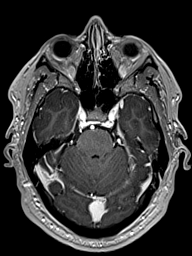
[im 69/160]
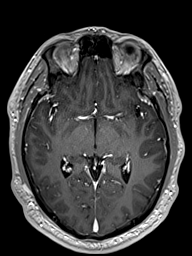
[im 91/160]
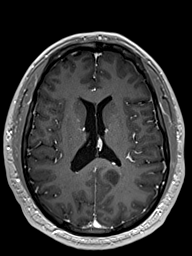
[im 114/160]
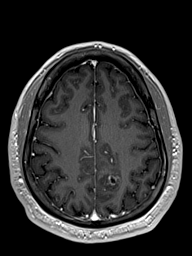
[im 137/160]
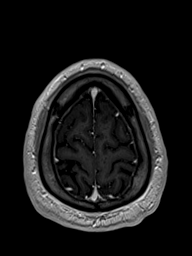
[im 160/160]
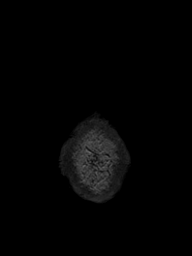

[Series 18: T1 post-contrast · coronal · 4.5mm · 0.72mm/px · 2 of 35 slices shown (1 of 2)]
[im 1/35]
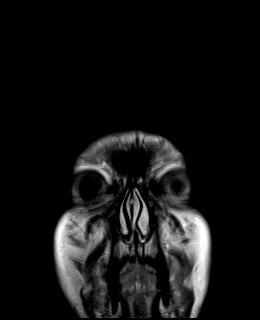
[im 35/35]
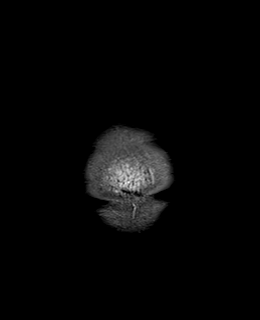

[Series 19: T1 post-contrast · sagittal · 4.0mm · 0.75mm/px · 1 of 31 slices shown (2 of 2)]
[im 1/31]
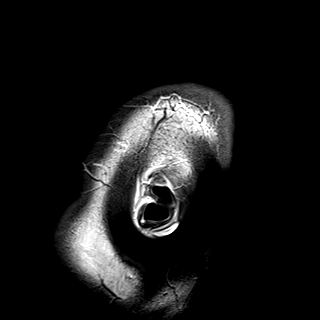

[48 of 48 positions shown; findings below may reference images not displayed]

FINDINGS: Brain: There is a heterogeneously T2 and FLAIR hyperintense mass
involving cortex and white matter in the parasagittal frontal and
parietal lobes including involvement of the posterior cingulate
gyrus which measures 6.0 x 2.6 x 5.0 cm. The mass demonstrates mild,
heterogeneous enhancement which is greatest superiorly, and there is
a small amount of susceptibility artifact which may reflect chronic
blood products or mineralization. Portions of the mass demonstrate
restricted diffusion suggesting hypercellularity.

The brain is normal in signal elsewhere. No acute infarct, midline
shift, or extra-axial fluid collection is evident. The ventricles
are normal in size aside from minimal mass effect on the posterior
body of the left lateral ventricle by the above described mass.

Vascular: Major intracranial vascular flow voids are preserved.

Skull and upper cervical spine: Unremarkable bone marrow signal.

Sinuses/Orbits: Unremarkable orbits. Paranasal sinuses and mastoid
air cells are clear.

Other: None.
IMPRESSION: 6 cm mildly enhancing left frontoparietal mass most consistent with
a primary CNS neoplasm.

These results will be called to the ordering clinician or
representative by the Radiologist Assistant, and communication
documented in the PACS or [REDACTED].

## 2022-12-24 ENCOUNTER — Telehealth: Payer: Self-pay | Admitting: Internal Medicine

## 2023-01-13 ENCOUNTER — Ambulatory Visit (HOSPITAL_COMMUNITY)
Admission: RE | Admit: 2023-01-13 | Discharge: 2023-01-13 | Disposition: A | Discharge status: Home / Self Care | Payer: BC Managed Care – PPO | Source: Ambulatory Visit | Attending: Internal Medicine | Admitting: Internal Medicine

## 2023-01-13 DIAGNOSIS — C719 Malignant neoplasm of brain, unspecified: Secondary | ICD-10-CM | POA: Diagnosis present

## 2023-01-13 MED ORDER — GADOBUTROL 1 MMOL/ML IV SOLN
10.0000 mL | Freq: Once | INTRAVENOUS | Status: AC | PRN
  Administered 2023-01-13: 10 mL via INTRAVENOUS

## 2023-01-17 ENCOUNTER — Ambulatory Visit: Payer: BC Managed Care – PPO | Admitting: Internal Medicine

## 2023-01-17 ENCOUNTER — Inpatient Hospital Stay: Payer: BC Managed Care – PPO

## 2023-01-20 ENCOUNTER — Other Ambulatory Visit: Payer: Self-pay

## 2023-01-20 ENCOUNTER — Inpatient Hospital Stay: Payer: BC Managed Care – PPO | Attending: Internal Medicine | Admitting: Internal Medicine

## 2023-01-20 VITALS — BP 128/92 | HR 56 | Temp 98.1°F | Resp 16 | Wt 254.6 lb

## 2023-01-20 DIAGNOSIS — Z79899 Other long term (current) drug therapy: Secondary | ICD-10-CM | POA: Insufficient documentation

## 2023-01-20 DIAGNOSIS — C719 Malignant neoplasm of brain, unspecified: Secondary | ICD-10-CM | POA: Diagnosis not present

## 2023-01-20 DIAGNOSIS — R42 Dizziness and giddiness: Secondary | ICD-10-CM | POA: Diagnosis not present

## 2023-01-20 DIAGNOSIS — R569 Unspecified convulsions: Secondary | ICD-10-CM | POA: Diagnosis not present

## 2023-01-20 DIAGNOSIS — R253 Fasciculation: Secondary | ICD-10-CM | POA: Insufficient documentation

## 2023-01-20 DIAGNOSIS — Z87891 Personal history of nicotine dependence: Secondary | ICD-10-CM | POA: Diagnosis not present

## 2023-01-20 DIAGNOSIS — G4089 Other seizures: Secondary | ICD-10-CM | POA: Insufficient documentation

## 2023-01-20 DIAGNOSIS — C711 Malignant neoplasm of frontal lobe: Secondary | ICD-10-CM | POA: Insufficient documentation

## 2023-01-20 NOTE — Progress Notes (Signed)
[facility] at [facility] [address]  [place], [place] [address] [phone]   Interval Evaluation  Date of Service: 01/20/23 Patient Name: Edwin Moreno Patient MRN: [id number] Patient DOB: 01-Mar-1976 Provider: Henreitta Leber, MD  Identifying Statement:  Edwin Moreno is a 47 y.o. 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 Maurice Small; path is WHO 3 oligodendroglioma, IDH-mt   03/23/2021 - 05/06/2021 Radiation Therapy   IMRT and daily Temodar 75mg /m2 Edwin Moreno)   06/14/2021 - 06/14/2021 Chemotherapy   Patient is on Treatment Plan : BRAIN ANAPLASTIC GLIOMA GRADE III Temozolomide Post XRT q28d       Biomarkers:  MGMT Unknown.  IDH 1/2 Mutated.  1p/19q codeleted  TERT Unknown   Interval History: Edwin Moreno presents today for follow up after recent MRI brain.  Continues to have occasional seizure auras, right sided twitching.  He is doing well dosing lamictal 200mg  BID.  No new or progressive changes. No recent changes with regards to right leg numbness.   H+P (03/02/21) Patient presented to medical attention this July with complaint of several years history of dizziness.  He described episodes of dizziness which would last for seconds to a minute, would come on "at any time".  They did/do not lead to any impairment or dyfunction.  Overall frequency had increased over the past months, to greater than once per week.  CNS imaging demonstrated an enhancing mass within the left frontal lobe, consistent with primary brain tumor.  He underwent craniotomy and resection with Dr. Maurice Small on 02/13/21; path demonstrates high grade glioma with IDH mutation.  Following  surgery, he has not complained of dizzy episodes, or any neurologic issues.  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  Medications: Current Outpatient Medications on File Prior to Visit  Medication Sig Dispense Refill   allopurinol (ZYLOPRIM) 300 MG tablet Take 1 tablet (300 mg total) by mouth daily. (Needs to be seen before next refill) 30 tablet 0   Ascorbic Acid (VITAMIN C) 1000 MG tablet Take 1,000 mg by mouth daily.     cetirizine (ZYRTEC) 10 MG tablet Take 10 mg by mouth daily.     Cholecalciferol (VITAMIN D3) 50 MCG (2000 UT) TABS Take 2,000 Units by mouth daily.     escitalopram (LEXAPRO) 10 MG tablet Take 10 mg by mouth daily.     Glucosamine HCl 1500 MG TABS Take 1,500 mg by mouth daily.     indomethacin (INDOCIN SR) 75 MG CR capsule Take 75 mg by mouth 2 (two) times daily with a meal.     lamoTRIgine (LAMICTAL) 200 MG tablet TAKE ONE TABLET TWICE DAILY 180 tablet 1   Omega-3 Fatty Acids (FISH OIL) 1200 MG CAPS Take 1,200 mg by mouth daily.     omeprazole (PRILOSEC) 40 MG capsule TAKE (1) CAPSULE DAILY (Needs to be seen before next refill) (Patient taking differently: Take 40 mg by mouth daily.) 30 capsule 0   pravastatin (PRAVACHOL) 40 MG tablet TAKE (1/2) TABLET DAILY. (Needs to be seen before next refill) (Patient taking differently: Take 20 mg by mouth daily.) 90 tablet 1   zinc gluconate 50 MG tablet Take 50 mg by  mouth daily.     lacosamide (VIMPAT) 50 MG TABS tablet Take 1 tablet (50 mg total) by mouth 2 (two) times daily. 60 tablet 2   prochlorperazine (COMPAZINE) 10 MG tablet Take 1 tablet (10 mg total) by mouth every 6 (six) hours as needed for nausea or vomiting. 30 tablet 1   No current facility-administered medications on file prior to visit.    Allergies: No Known Allergies Past Medical History:  Past Medical History:  Diagnosis Date   Arthritis    Asthma    CHILDHOOD HX   GERD (gastroesophageal reflux disease)    Gout    H/O removal of  cyst    hip as a child   Hyperlipidemia    Past Surgical History:  Past Surgical History:  Procedure Laterality Date   APPLICATION OF CRANIAL NAVIGATION N/A 02/13/2021   Procedure: APPLICATION OF CRANIAL NAVIGATION;  Surgeon: Jadene Pierini, MD;  Location: [facility];  Service: Neurosurgery;  Laterality: N/A;  RM 19   CRANIOTOMY Left 02/13/2021   Procedure: Left Craniotomy for tumor resection with brainlab;  Surgeon: Jadene Pierini, MD;  Location[facility];  Service: Neurosurgery;  Laterality: Left;   Social History:  Social History   Socioeconomic History   Marital status: Married    Spouse name: Not on file   Number of children: Not on file   Years of education: Not on file   Highest education level: Not on file  Occupational History   Not on file  Tobacco Use   Smoking status: Never   Smokeless tobacco: Former    Types: Designer, multimedia Use   Vaping status: Never Used  Substance and Sexual Activity   Alcohol use: Yes    Comment: 3-4 x weekly- beers   Drug use: No   Sexual activity: Yes  Other Topics Concern   Not on file  Social History Narrative   Not on file   Social Determinants of Health   Financial Resource Strain: Not on file  Food Insecurity: Not on file  Transportation Needs: Not on file  Physical Activity: Not on file  Stress: Not on file  Social Connections: Not on file  Intimate Partner Violence: Not on file   Family History: No family history on file.  Review of Systems: Constitutional: Doesn't report fevers, chills or abnormal weight loss Eyes: Doesn't report blurriness of vision Ears, nose, mouth, throat, and face: Doesn't report sore throat Respiratory: Doesn't report cough, dyspnea or wheezes Cardiovascular: Doesn't report palpitation, chest discomfort  Gastrointestinal:  Doesn't report nausea, constipation, diarrhea GU: Doesn't report incontinence Skin: Doesn't report skin rashes Neurological: Per HPI Musculoskeletal: Doesn't report joint  pain Behavioral/Psych: Doesn't report anxiety  Physical Exam: Vitals:   01/20/23 1004  BP: (!) 128/92  Pulse: (!) 56  Resp: 16  Temp: 98.1 F (36.7 C)  SpO2: 100%   KPS: 90. General: Alert, cooperative, pleasant, in no acute distress Head: Normal EENT: No conjunctival injection or scleral icterus.  Lungs: Resp effort normal Cardiac: Regular rate Abdomen: Non-distended abdomen Skin: No rashes cyanosis or petechiae. Extremities: No clubbing or edema  Neurologic Exam: Mental Status: Awake, alert, attentive to examiner. Oriented to self and environment. Language is fluent with intact comprehension.  Cranial Nerves: Visual acuity is grossly normal. Visual fields are full. Extra-ocular movements intact. No ptosis. Face is symmetric Motor: Tone and bulk are normal. Power is full in both arms and legs. Reflexes are symmetric, no pathologic reflexes present.  Sensory: Intact to light  touch Gait: Normal.   Labs: I have reviewed the data as listed    Component Value Date/Time   NA 141 01/11/2022 0923   NA 141 12/19/2018 1048   K 4.6 01/11/2022 0923   CL 105 01/11/2022 0923   CO2 30 01/11/2022 0923   GLUCOSE 91 01/11/2022 0923   BUN 18 01/11/2022 0923   BUN 15 12/19/2018 1048   CREATININE 1.01 01/11/2022 0923   CALCIUM 9.8 01/11/2022 0923   PROT 7.2 01/11/2022 0923   PROT 6.9 12/19/2018 1048   ALBUMIN 4.8 01/11/2022 0923   ALBUMIN 5.0 12/19/2018 1048   AST 23 01/11/2022 0923   ALT 34 01/11/2022 0923   ALKPHOS 79 01/11/2022 0923   BILITOT 0.8 01/11/2022 0923   GFRNONAA >60 01/11/2022 0923   GFRAA 102 12/19/2018 1048   Lab Results  Component Value Date   WBC 4.4 01/11/2022   NEUTROABS 3.0 01/11/2022   HGB 15.3 01/11/2022   HCT 43.6 01/11/2022   MCV 95.0 01/11/2022   PLT 198 01/11/2022   Imaging:  [facility] Clinician Interpretation: I have personally reviewed the CNS images as listed.  My interpretation, in the context of the patient's clinical presentation, is stable  disease pending official read  No results found.   Assessment/Plan Anaplastic oligodendroglioma, IDH mutant and 1p/19q-codeleted ([facility])  Focal seizures ([facility])  Edwin Moreno is clinically stable today, no new or progressive changes.  MRI demonstrates stable findings, pending official read.  Will con't imaging surveillance off systemic therapy.  Will con't Lamictal 200mg  BID, Vimpat 50mg  BID.  Ativan 2mg  PR q6 PRN will also continue prolonged or clustered breakthrough seizures.   We ask that Edwin Moreno return to clinic in 4 months following next brain MRI, or sooner as needed.  All questions were answered. The patient knows to call the clinic with any problems, questions or concerns. No barriers to learning were detected.  The total time spent in the encounter was 30 minutes and more than 50% was on counseling and review of test results   Henreitta Leber, MD Medical Director of Neuro-Oncology [facility] at [facility] 01/20/23 12:00 PM

## 2023-01-24 ENCOUNTER — Inpatient Hospital Stay: Payer: BC Managed Care – PPO

## 2023-02-08 ENCOUNTER — Other Ambulatory Visit: Payer: Self-pay | Admitting: Internal Medicine

## 2023-02-08 DIAGNOSIS — R569 Unspecified convulsions: Secondary | ICD-10-CM

## 2023-02-08 DIAGNOSIS — C719 Malignant neoplasm of brain, unspecified: Secondary | ICD-10-CM

## 2023-05-19 ENCOUNTER — Ambulatory Visit (HOSPITAL_COMMUNITY)
Admission: RE | Admit: 2023-05-19 | Discharge: 2023-05-19 | Disposition: A | Discharge status: Home / Self Care | Payer: BC Managed Care – PPO | Source: Ambulatory Visit | Attending: Internal Medicine | Admitting: Internal Medicine

## 2023-05-19 DIAGNOSIS — C719 Malignant neoplasm of brain, unspecified: Secondary | ICD-10-CM | POA: Diagnosis present

## 2023-05-19 MED ORDER — GADOBUTROL 1 MMOL/ML IV SOLN
10.0000 mL | Freq: Once | INTRAVENOUS | Status: AC | PRN
  Administered 2023-05-19: 10 mL via INTRAVENOUS

## 2023-05-26 ENCOUNTER — Inpatient Hospital Stay: Payer: BC Managed Care – PPO | Attending: Internal Medicine | Admitting: Internal Medicine

## 2023-05-26 VITALS — BP 127/86 | HR 65 | Temp 97.5°F | Resp 16 | Wt 250.4 lb

## 2023-05-26 DIAGNOSIS — Z79899 Other long term (current) drug therapy: Secondary | ICD-10-CM | POA: Insufficient documentation

## 2023-05-26 DIAGNOSIS — C719 Malignant neoplasm of brain, unspecified: Secondary | ICD-10-CM | POA: Insufficient documentation

## 2023-05-26 DIAGNOSIS — R42 Dizziness and giddiness: Secondary | ICD-10-CM | POA: Diagnosis not present

## 2023-05-26 DIAGNOSIS — G9349 Other encephalopathy: Secondary | ICD-10-CM | POA: Insufficient documentation

## 2023-05-26 NOTE — Progress Notes (Signed)
[facility] at [facility] [address]  [place], [place] [address] [phone]   Interval Evaluation  Date of Service: 05/26/23 Patient Name: Edwin Moreno Patient MRN: [id number] Patient DOB: 1975/09/20 Provider: Henreitta Leber, MD  Identifying Statement:  Edwin Moreno is a 47 y.o. 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 Maurice Small; path is WHO 3 oligodendroglioma, IDH-mt   03/23/2021 - 05/06/2021 Radiation Therapy   IMRT and daily Temodar 75mg /m2 Basilio [place])   06/14/2021 - 06/14/2021 Chemotherapy   Patient is on Treatment Plan : BRAIN ANAPLASTIC GLIOMA GRADE III Temozolomide Post XRT q28d       Biomarkers:  MGMT Unknown.  IDH 1/2 Mutated.  1p/19q codeleted  TERT Unknown   Interval History: Edwin Moreno presents today for follow up after recent MRI brain.  Does describe 2 small seizures in the past 4 months, both associated with stress increasing at home.  He is doing well dosing lamictal 200mg  BID.  No new or progressive changes. No recent changes with regards to right leg numbness.   H+P (03/02/21) Patient presented to medical attention this July with complaint of several years history of dizziness.  He described episodes of dizziness which would last for seconds to a minute, would come on "at any time".  They did/do not lead to any impairment or dyfunction.  Overall frequency had increased over the past months, to greater than once per week.  CNS imaging demonstrated an enhancing mass within the left frontal lobe, consistent with primary brain tumor.  He underwent craniotomy and resection with Dr. Maurice Small on 02/13/21; path demonstrates high grade  glioma with IDH mutation.  Following surgery, he has not complained of dizzy episodes, or any neurologic issues.  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  Medications: Current Outpatient Medications on File Prior to Visit  Medication Sig Dispense Refill   allopurinol (ZYLOPRIM) 300 MG tablet Take 1 tablet (300 mg total) by mouth daily. (Needs to be seen before next refill) 30 tablet 0   Ascorbic Acid (VITAMIN C) 1000 MG tablet Take 1,000 mg by mouth daily.     cetirizine (ZYRTEC) 10 MG tablet Take 10 mg by mouth daily.     Cholecalciferol (VITAMIN D3) 50 MCG (2000 UT) TABS Take 2,000 Units by mouth daily.     escitalopram (LEXAPRO) 10 MG tablet Take 10 mg by mouth daily.     Glucosamine HCl 1500 MG TABS Take 1,500 mg by mouth daily.     indomethacin (INDOCIN SR) 75 MG CR capsule Take 75 mg by mouth 2 (two) times daily with a meal.     lamoTRIgine (LAMICTAL) 200 MG tablet TAKE ONE TABLET TWICE DAILY 180 tablet 1   Omega-3 Fatty Acids (FISH OIL) 1200 MG CAPS Take 1,200 mg by mouth daily.     omeprazole (PRILOSEC) 40 MG capsule TAKE (1) CAPSULE DAILY (Needs to be seen before next refill) (Patient taking differently: Take 40 mg by mouth daily.) 30 capsule 0   pravastatin (PRAVACHOL) 40 MG tablet TAKE (1/2) TABLET DAILY. (Needs to be seen before next refill) (Patient taking differently: Take 20 mg by mouth daily.) 90 tablet 1   zinc  gluconate 50 MG tablet Take 50 mg by mouth daily.     prochlorperazine (COMPAZINE) 10 MG tablet Take 1 tablet (10 mg total) by mouth every 6 (six) hours as needed for nausea or vomiting. 30 tablet 1   No current facility-administered medications on file prior to visit.    Allergies: No Known Allergies Past Medical History:  Past Medical History:  Diagnosis Date   Arthritis    Asthma    CHILDHOOD HX   GERD (gastroesophageal reflux disease)    Gout    H/O removal of cyst    hip as a child   Hyperlipidemia    Past Surgical History:   Past Surgical History:  Procedure Laterality Date   APPLICATION OF CRANIAL NAVIGATION N/A 02/13/2021   Procedure: APPLICATION OF CRANIAL NAVIGATION;  Surgeon: Jadene Pierini, MD;  Location: [facility] OR;  Service: Neurosurgery;  Laterality: N/A;  RM 19   CRANIOTOMY Left 02/13/2021   Procedure: Left Craniotomy for tumor resection with brainlab;  Surgeon: Jadene Pierini, MD;  Location: [facility] OR;  Service: Neurosurgery;  Laterality: Left;   Social History:  Social History   Socioeconomic History   Marital status: Married    Spouse name: Not on file   Number of children: Not on file   Years of education: Not on file   Highest education level: Not on file  Occupational History   Not on file  Tobacco Use   Smoking status: Never   Smokeless tobacco: Former    Types: Designer, multimedia Use   Vaping status: Never Used  Substance and Sexual Activity   Alcohol use: Yes    Comment: 3-4 x weekly- beers   Drug use: No   Sexual activity: Yes  Other Topics Concern   Not on file  Social History Narrative   Not on file   Social Determinants of Health   Financial Resource Strain: Not on file  Food Insecurity: Not on file  Transportation Needs: Not on file  Physical Activity: Not on file  Stress: Not on file  Social Connections: Not on file  Intimate Partner Violence: Not on file   Family History: No family history on file.  Review of Systems: Constitutional: Doesn't report fevers, chills or abnormal weight loss Eyes: Doesn't report blurriness of vision Ears, nose, mouth, throat, and face: Doesn't report sore throat Respiratory: Doesn't report cough, dyspnea or wheezes Cardiovascular: Doesn't report palpitation, chest discomfort  Gastrointestinal:  Doesn't report nausea, constipation, diarrhea GU: Doesn't report incontinence Skin: Doesn't report skin rashes Neurological: Per HPI Musculoskeletal: Doesn't report joint pain Behavioral/Psych: Doesn't report anxiety  Physical  Exam: Vitals:   05/26/23 1034  BP: 127/86  Pulse: 65  Resp: 16  Temp: (!) 97.5 F (36.4 C)  SpO2: 100%   KPS: 90. General: Alert, cooperative, pleasant, in no acute distress Head: Normal EENT: No conjunctival injection or scleral icterus.  Lungs: Resp effort normal Cardiac: Regular rate Abdomen: Non-distended abdomen Skin: No rashes cyanosis or petechiae. Extremities: No clubbing or edema  Neurologic Exam: Mental Status: Awake, alert, attentive to examiner. Oriented to self and environment. Language is fluent with intact comprehension.  Cranial Nerves: Visual acuity is grossly normal. Visual fields are full. Extra-ocular movements intact. No ptosis. Face is symmetric Motor: Tone and bulk are normal. Power is full in both arms and legs. Reflexes are symmetric, no pathologic reflexes present.  Sensory: Intact to light touch Gait: Normal.   Labs: I have reviewed the data as listed  Component Value Date/Time   NA 141 01/11/2022 0923   NA 141 12/19/2018 1048   K 4.6 01/11/2022 0923   CL 105 01/11/2022 0923   CO2 30 01/11/2022 0923   GLUCOSE 91 01/11/2022 0923   BUN 18 01/11/2022 0923   BUN 15 12/19/2018 1048   CREATININE 1.01 01/11/2022 0923   CALCIUM 9.8 01/11/2022 0923   PROT 7.2 01/11/2022 0923   PROT 6.9 12/19/2018 1048   ALBUMIN 4.8 01/11/2022 0923   ALBUMIN 5.0 12/19/2018 1048   AST 23 01/11/2022 0923   ALT 34 01/11/2022 0923   ALKPHOS 79 01/11/2022 0923   BILITOT 0.8 01/11/2022 0923   GFRNONAA >60 01/11/2022 0923   GFRAA 102 12/19/2018 1048   Lab Results  Component Value Date   WBC 4.4 01/11/2022   NEUTROABS 3.0 01/11/2022   HGB 15.3 01/11/2022   HCT 43.6 01/11/2022   MCV 95.0 01/11/2022   PLT 198 01/11/2022   Imaging:  [facility] Clinician Interpretation: I have personally reviewed the CNS images as listed.  My interpretation, in the context of the patient's clinical presentation, is stable disease pending official read  No results  found.   Assessment/Plan Anaplastic oligodendroglioma, IDH mutant and 1p/19q-codeleted (HCC)  Edwin Moreno is clinically stable today, no new or progressive changes.  MRI demonstrates stable findings, pending official read.  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  Will con't imaging surveillance off systemic therapy.  Will con't Lamictal 200mg  BID as prior.  Ativan 2mg  PR q6 PRN will also continue prolonged or clustered breakthrough seizures.   We ask that Edwin Moreno return to clinic in 4 months following next brain MRI, or sooner as needed.  All questions were answered. The patient knows to call the clinic with any problems, questions or concerns. No barriers to learning were detected.  The total time spent in the encounter was 40 minutes and more than 50% was on counseling and review of test results   Henreitta Leber, MD Medical Director of Neuro-Oncology [facility] 05/26/23 10:42 AM

## 2023-06-21 ENCOUNTER — Telehealth: Payer: Self-pay | Admitting: Internal Medicine

## 2023-06-21 NOTE — Telephone Encounter (Signed)
Called and spoke with patient to scheduled 4 month f/u visit. Appt scheduled.  Per 11/21 los and Nurse Henlawson Bing.

## 2023-07-26 ENCOUNTER — Other Ambulatory Visit: Payer: Self-pay | Admitting: Internal Medicine

## 2023-07-26 DIAGNOSIS — C719 Malignant neoplasm of brain, unspecified: Secondary | ICD-10-CM

## 2023-07-26 DIAGNOSIS — R569 Unspecified convulsions: Secondary | ICD-10-CM

## 2023-08-19 ENCOUNTER — Telehealth: Payer: Self-pay

## 2023-08-19 NOTE — Telephone Encounter (Signed)
Open in error

## 2023-08-23 ENCOUNTER — Telehealth: Payer: Self-pay

## 2023-08-23 NOTE — Telephone Encounter (Signed)
Notified pt of completed Disability Forms. Pt verbalized understanding.

## 2023-09-22 ENCOUNTER — Ambulatory Visit (HOSPITAL_COMMUNITY)
Admission: RE | Admit: 2023-09-22 | Discharge: 2023-09-22 | Disposition: A | Discharge status: Home / Self Care | Payer: BC Managed Care – PPO | Source: Ambulatory Visit | Attending: Internal Medicine | Admitting: Internal Medicine

## 2023-09-22 DIAGNOSIS — C719 Malignant neoplasm of brain, unspecified: Secondary | ICD-10-CM | POA: Insufficient documentation

## 2023-09-22 MED ORDER — GADOBUTROL 1 MMOL/ML IV SOLN
10.0000 mL | Freq: Once | INTRAVENOUS | Status: AC | PRN
  Administered 2023-09-22: 10 mL via INTRAVENOUS

## 2023-09-26 ENCOUNTER — Other Ambulatory Visit: Payer: Self-pay | Admitting: *Deleted

## 2023-09-26 DIAGNOSIS — C719 Malignant neoplasm of brain, unspecified: Secondary | ICD-10-CM

## 2023-09-27 ENCOUNTER — Inpatient Hospital Stay: Payer: BC Managed Care – PPO | Attending: Internal Medicine | Admitting: Internal Medicine

## 2023-09-27 ENCOUNTER — Inpatient Hospital Stay: Payer: BC Managed Care – PPO

## 2023-09-27 ENCOUNTER — Telehealth: Payer: Self-pay | Admitting: Internal Medicine

## 2023-09-27 VITALS — BP 134/81 | HR 54 | Temp 98.0°F | Resp 16 | Wt 252.3 lb

## 2023-09-27 DIAGNOSIS — G4089 Other seizures: Secondary | ICD-10-CM | POA: Insufficient documentation

## 2023-09-27 DIAGNOSIS — R569 Unspecified convulsions: Secondary | ICD-10-CM | POA: Diagnosis not present

## 2023-09-27 DIAGNOSIS — Z79899 Other long term (current) drug therapy: Secondary | ICD-10-CM | POA: Diagnosis not present

## 2023-09-27 DIAGNOSIS — C711 Malignant neoplasm of frontal lobe: Secondary | ICD-10-CM | POA: Insufficient documentation

## 2023-09-27 DIAGNOSIS — C719 Malignant neoplasm of brain, unspecified: Secondary | ICD-10-CM

## 2023-09-27 DIAGNOSIS — R42 Dizziness and giddiness: Secondary | ICD-10-CM | POA: Diagnosis not present

## 2023-09-27 LAB — CBC WITH DIFFERENTIAL (CANCER CENTER ONLY)
Abs Immature Granulocytes: 0.04 10*3/uL (ref 0.00–0.07)
Basophils Absolute: 0 10*3/uL (ref 0.0–0.1)
Basophils Relative: 1 %
Eosinophils Absolute: 0.1 10*3/uL (ref 0.0–0.5)
Eosinophils Relative: 1 %
HCT: 44.8 % (ref 39.0–52.0)
Hemoglobin: 15.5 g/dL (ref 13.0–17.0)
Immature Granulocytes: 1 %
Lymphocytes Relative: 31 %
Lymphs Abs: 1.5 10*3/uL (ref 0.7–4.0)
MCH: 32.3 pg (ref 26.0–34.0)
MCHC: 34.6 g/dL (ref 30.0–36.0)
MCV: 93.3 fL (ref 80.0–100.0)
Monocytes Absolute: 0.3 10*3/uL (ref 0.1–1.0)
Monocytes Relative: 6 %
Neutro Abs: 3 10*3/uL (ref 1.7–7.7)
Neutrophils Relative %: 60 %
Platelet Count: 235 10*3/uL (ref 150–400)
RBC: 4.8 MIL/uL (ref 4.22–5.81)
RDW: 11.9 % (ref 11.5–15.5)
WBC Count: 4.9 10*3/uL (ref 4.0–10.5)
nRBC: 0 % (ref 0.0–0.2)

## 2023-09-27 LAB — CMP (CANCER CENTER ONLY)
ALT: 27 U/L (ref 0–44)
AST: 19 U/L (ref 15–41)
Albumin: 4.9 g/dL (ref 3.5–5.0)
Alkaline Phosphatase: 72 U/L (ref 38–126)
Anion gap: 6 (ref 5–15)
BUN: 14 mg/dL (ref 6–20)
CO2: 29 mmol/L (ref 22–32)
Calcium: 9.8 mg/dL (ref 8.9–10.3)
Chloride: 104 mmol/L (ref 98–111)
Creatinine: 0.99 mg/dL (ref 0.61–1.24)
GFR, Estimated: >60 mL/min (ref >60)
Glucose, Bld: 91 mg/dL (ref 70–99)
Potassium: 4.2 mmol/L (ref 3.5–5.1)
Sodium: 139 mmol/L (ref 135–145)
Total Bilirubin: 0.6 mg/dL (ref 0.0–1.2)
Total Protein: 7.3 g/dL (ref 6.5–8.1)

## 2023-09-27 NOTE — Telephone Encounter (Signed)
 Patient scheduled appointments. Patient is aware of all appointment details.

## 2023-09-27 NOTE — Progress Notes (Signed)
 Edwin Moreno at [facility] [address]  [place], [place] [address] [phone]   Interval Evaluation  Date of Service: 09/27/23 [name] MRN: [id number] Moreno DOB: [birth year] Provider: Henreitta Leber, MD  Identifying Statement:  Edwin Moreno is a 48 y.o. 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 Maurice Moreno; path is WHO 3 oligodendroglioma, IDH-mt   03/23/2021 - 05/06/2021 Radiation Therapy   IMRT and daily Temodar 75mg /m2 Edwin Moreno)   06/14/2021 - 06/14/2021 Chemotherapy   Moreno is on Treatment Plan : BRAIN ANAPLASTIC GLIOMA GRADE III Temozolomide Post XRT q28d       Biomarkers:  MGMT Unknown.  IDH 1/2 Mutated.  1p/19q codeleted  TERT Unknown   Interval History: Edwin Moreno presents today for follow up after recent MRI brain.  3 seizures since prior visit 4 months ago, all of typical semiology.  He is doing well dosing lamictal 200mg  BID.  No new or progressive changes. No recent changes with regards to right leg numbness.   Edwin Moreno presented to medical attention this July with complaint of several years history of dizziness.  He described episodes of dizziness which would last for seconds to a minute, would come on "at any time".  They did/do not lead to any impairment or dyfunction.  Overall frequency had increased over the past months, to greater than once per week.  CNS imaging demonstrated an enhancing mass within the left frontal lobe, consistent with primary brain tumor.  He underwent craniotomy and resection with Dr. Maurice Moreno on 02/13/21; path demonstrates high grade glioma with IDH mutation.   Following surgery, he has not complained of dizzy episodes, or any neurologic issues.  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  Medications: Current Outpatient Medications on File Prior to Visit  Medication Sig Dispense Refill   allopurinol (ZYLOPRIM) 300 MG tablet Take 1 tablet (300 mg total) by mouth daily. (Needs to be seen before next refill) 30 tablet 0   Ascorbic Acid (VITAMIN C) 1000 MG tablet Take 1,000 mg by mouth daily.     cetirizine (ZYRTEC) 10 MG tablet Take 10 mg by mouth daily.     Cholecalciferol (VITAMIN D3) 50 MCG (2000 UT) TABS Take 2,000 Units by mouth daily.     escitalopram (LEXAPRO) 10 MG tablet Take 10 mg by mouth daily.     Glucosamine HCl 1500 MG TABS Take 1,500 mg by mouth daily.     indomethacin (INDOCIN SR) 75 MG CR capsule Take 75 mg by mouth 2 (two) times daily with a meal.     lamoTRIgine (LAMICTAL) 200 MG tablet TAKE ONE TABLET TWICE DAILY 180 tablet 1   Omega-3 Fatty Acids (FISH OIL) 1200 MG CAPS Take 1,200 mg by mouth daily.     omeprazole (PRILOSEC) 40 MG capsule TAKE (1) CAPSULE DAILY (Needs to be seen before next refill) (Moreno taking differently: Take 40 mg by mouth daily.) 30 capsule 0   pravastatin (PRAVACHOL) 40 MG tablet TAKE (1/2) TABLET DAILY. (Needs to be seen before next refill) (Moreno taking differently: Take 20 mg by mouth daily.) 90 tablet 1   zinc gluconate 50 MG tablet Take  50 mg by mouth daily.     prochlorperazine (COMPAZINE) 10 MG tablet Take 1 tablet (10 mg total) by mouth every 6 (six) hours as needed for nausea or vomiting. 30 tablet 1   No current facility-administered medications on file prior to visit.    Allergies: No Known Allergies Past Medical History:  Past Medical History:  Diagnosis Date   Arthritis    Asthma    CHILDHOOD HX   GERD (gastroesophageal reflux disease)    Gout    H/O removal of cyst    hip as a child   Hyperlipidemia    Past Surgical History:  Past Surgical History:   Procedure Laterality Date   APPLICATION OF CRANIAL NAVIGATION N/A 02/13/2021   Procedure: APPLICATION OF CRANIAL NAVIGATION;  Surgeon: Jadene Pierini, MD;  Location: [facility] OR;  Service: Neurosurgery;  Laterality: N/A;  RM 19   CRANIOTOMY Left 02/13/2021   Procedure: Left Craniotomy for tumor resection with brainlab;  Surgeon: Jadene Pierini, MD;  Location: [facility];  Service: Neurosurgery;  Laterality: Left;   Social History:  Social History   Socioeconomic History   Marital status: Married    Spouse name: Not on file   Number of children: Not on file   Years of education: Not on file   Highest education level: Not on file  Occupational History   Not on file  Tobacco Use   Smoking status: Never   Smokeless tobacco: Former    Types: Designer, multimedia Use   Vaping status: Never Used  Substance and Sexual Activity   Alcohol use: Yes    Comment: 3-4 x weekly- beers   Drug use: No   Sexual activity: Yes  Other Topics Concern   Not on file  Social History Narrative   Not on file   Social Drivers of Health   Financial Resource Strain: Not on file  Food Insecurity: Not on file  Transportation Needs: Not on file  Physical Activity: Not on file  Stress: Not on file  Social Connections: Not on file  Intimate Partner Violence: Not At Risk (09/04/2023)   Received from [facility]   HITS    Over the last 12 months how often did your partner physically hurt you?: Never    Over the last 12 months how often did your partner insult you or talk down to you?: Never    Over the last 12 months how often did your partner threaten you with physical harm?: Never    Over the last 12 months how often did your partner scream or curse at you?: Never   Family History: No family history on file.  Review of Systems: Constitutional: Doesn't report fevers, chills or abnormal weight loss Eyes: Doesn't report blurriness of vision Ears, nose, mouth, throat, and face: Doesn't report sore  throat Respiratory: Doesn't report cough, dyspnea or wheezes Cardiovascular: Doesn't report palpitation, chest discomfort  Gastrointestinal:  Doesn't report nausea, constipation, diarrhea GU: Doesn't report incontinence Skin: Doesn't report skin rashes Neurological: Per HPI Musculoskeletal: Doesn't report joint pain Behavioral/Psych: Doesn't report anxiety  Physical Exam: Vitals:   09/27/23 0848  BP: 134/81  Pulse: (!) 54  Resp: 16  Temp: 98 F (36.7 C)  SpO2: 99%   KPS: 90. General: Alert, cooperative, pleasant, in no acute distress Head: Normal EENT: No conjunctival injection or scleral icterus.  Lungs: Resp effort normal Cardiac: Regular rate Abdomen: Non-distended abdomen Skin: No rashes cyanosis or petechiae. Extremities: No clubbing or edema  Neurologic  Exam: Mental Status: Awake, alert, attentive to examiner. Oriented to self and environment. Language is fluent with intact comprehension.  Cranial Nerves: Visual acuity is grossly normal. Visual fields are full. Extra-ocular movements intact. No ptosis. Face is symmetric Motor: Tone and bulk are normal. Power is full in both arms and legs. Reflexes are symmetric, no pathologic reflexes present.  Sensory: Intact to light touch Gait: Normal.   Labs: I have reviewed the data as listed    Component Value Date/Time   NA 141 01/11/2022 0923   NA 141 12/19/2018 1048   K 4.6 01/11/2022 0923   CL 105 01/11/2022 0923   CO2 30 01/11/2022 0923   GLUCOSE 91 01/11/2022 0923   BUN 18 01/11/2022 0923   BUN 15 12/19/2018 1048   CREATININE 1.01 01/11/2022 0923   CALCIUM 9.8 01/11/2022 0923   PROT 7.2 01/11/2022 0923   PROT 6.9 12/19/2018 1048   ALBUMIN 4.8 01/11/2022 0923   ALBUMIN 5.0 12/19/2018 1048   AST 23 01/11/2022 0923   ALT 34 01/11/2022 0923   ALKPHOS 79 01/11/2022 0923   BILITOT 0.8 01/11/2022 0923   GFRNONAA >60 01/11/2022 0923   GFRAA 102 12/19/2018 1048   Lab Results  Component Value Date   WBC 4.9  09/27/2023   NEUTROABS 3.0 09/27/2023   HGB 15.5 09/27/2023   HCT 44.8 09/27/2023   MCV 93.3 09/27/2023   PLT 235 09/27/2023   Imaging:  [facility] Clinician Interpretation: I have personally reviewed the CNS images as listed.  My interpretation, in the context of the Moreno's clinical presentation, is stable disease    MR BRAIN W WO CONTRAST Result Date: 09/22/2023 CLINICAL DATA:  48 year old male with anaplastic oligodendroglioma. IDH mutant. Restaging. EXAM: MRI HEAD WITHOUT AND WITH CONTRAST TECHNIQUE: Multiplanar, multiecho pulse sequences of the brain and surrounding structures were obtained without and with intravenous contrast. CONTRAST:  10mL GADAVIST GADOBUTROL 1 MMOL/ML IV SOLN COMPARISON:  05/19/2023 and earlier. FINDINGS: Brain: Chronic right posterior and superior parietal lobe resection cavity. Stable cavity size, T2 appearance. Thickened, mildly T2 and FLAIR hyperintense gyri extending along the anterior inferior margin of the cavity (series 9, image 38) appears stable from 09/16/2022, this includes the posterior left cingulate. When compared to 09/09/2021 there is minimal progression of these findings as demonstrated on series 15, image 16 (versus series 12, image 14 in 2023). See also axial FLAIR series 9, image 39 currently. No suspicious DWI changes. And following contrast there is no suspicious enhancement. Mild hemosiderin unchanged. Background brain volume remains normal. No superimposed restricted diffusion to suggest acute infarction. No midline shift, ventriculomegaly, extra-axial collection or acute intracranial hemorrhage. Cervicomedullary junction and pituitary are within normal limits. Stable gray and Lampton matter signal elsewhere. No abnormal enhancement or dural thickening identified. Vascular: Major intracranial vascular flow voids are stable. Following contrast major dural venous sinuses are enhancing and appear to be patent. Skull and upper cervical spine: Stable vertex  craniotomy. Background bone marrow signal, visible cervical spine are negative. Sinuses/Orbits: Stable and negative. Other: Stable postoperative changes to the superior left scalp. IMPRESSION: 1. Essentially stable and satisfactory post treatment appearance of the brain. Very subtle increasing thickness and T2/FLAIR hyperintensity within the posterior left cingulate gyrus since March of 2023. Attention directed there on follow-up (series 9, image 39). 2.  No new intracranial abnormality. Electronically Signed   By: Odessa Fleming M.D.   On: 09/22/2023 09:33     Assessment/Plan Anaplastic oligodendroglioma, IDH mutant and 1p/19q-codeleted (HCC)  Focal seizures (HCC)  Edwin Moreno is clinically stable today, no new or progressive changes.    MRI demonstrates stable findings, with unchanged T2/FLAIR signal over the past year.  Will con't imaging surveillance off systemic therapy.  Will con't Lamictal 200mg  BID as prior.  Ativan 2mg  PR q6 PRN will also continue prolonged or clustered breakthrough seizures.   We ask that Edwin Moreno return to clinic in 4 months following next brain MRI, or sooner as needed.  All questions were answered. The Moreno knows to call the clinic with any problems, questions or concerns. No barriers to learning were detected.  The total time spent in the encounter was 40 minutes and more than 50% was on counseling and review of test results   Edwin Leber, MD Medical Director of Neuro-Oncology [facility] 09/27/23 9:01 AM

## 2023-11-07 ENCOUNTER — Telehealth: Payer: Self-pay | Admitting: *Deleted

## 2023-11-07 DIAGNOSIS — C719 Malignant neoplasm of brain, unspecified: Secondary | ICD-10-CM

## 2023-11-07 MED ORDER — LORAZEPAM 2 MG PO TABS: 2.0000 mg | ORAL_TABLET | Freq: Four times a day (QID) | ORAL | 0 refills | Status: AC | PRN

## 2023-11-07 NOTE — Telephone Encounter (Signed)
 Patient called to request refill of Lorazpem for seizure prevention to [facility].  Last refill was 04/30/2022 for quantity of 30.

## 2023-11-07 NOTE — Addendum Note (Signed)
 Addended by: Chizuko Trine K on: 11/07/2023 04:15 PM   Modules accepted: Orders

## 2023-12-19 ENCOUNTER — Ambulatory Visit: Attending: Orthopedic Surgery

## 2023-12-19 DIAGNOSIS — M5412 Radiculopathy, cervical region: Secondary | ICD-10-CM | POA: Diagnosis present

## 2023-12-19 NOTE — Therapy (Signed)
 OUTPATIENT PHYSICAL THERAPY CERVICAL EVALUATION   Patient Name: Edwin Moreno MRN: [id number] DOB:1975/07/24, 48 y.o., male Today's Date: 12/19/2023  END OF SESSION:  PT End of Session - 12/19/23 0848     Visit Number 1    Number of Visits 12    Date for PT Re-Evaluation 01/20/24    PT Start Time 0849    PT Stop Time 0934    PT Time Calculation (min) 45 min    Activity Tolerance Patient tolerated treatment well    Behavior During Therapy WFL for tasks assessed/performed          Past Medical History:  Diagnosis Date   Arthritis    Asthma    CHILDHOOD HX   GERD (gastroesophageal reflux disease)    Gout    H/O removal of cyst    hip as a child   Hyperlipidemia    Past Surgical History:  Procedure Laterality Date   APPLICATION OF CRANIAL NAVIGATION N/A 02/13/2021   Procedure: APPLICATION OF CRANIAL NAVIGATION;  Surgeon: Cannon Champion, MD;  Location: [facility] OR;  Service: Neurosurgery;  Laterality: N/A;  RM 19   CRANIOTOMY Left 02/13/2021   Procedure: Left Craniotomy for tumor resection with brainlab;  Surgeon: Cannon Champion, MD;  Location: [facility] OR;  Service: Neurosurgery;  Laterality: Left;   Patient Active Problem List   Diagnosis Date Noted   Focal seizures (HCC) 09/14/2021   Cancer of parietal lobe (HCC) 03/06/2021   Mass of parietal lobe 02/13/2021   Anaplastic oligodendroglioma, IDH mutant and 1p/19q-codeleted (HCC) 02/13/2021   Hypertriglyceridemia 09/18/2015   Gout 09/18/2015   Nodule, subcutaneous 05/02/2015   REFERRING PROVIDER: Janeth Medicus, MD   REFERRING DIAG: Radiculopathy, cervical region   THERAPY DIAG:  Radiculopathy, cervical region  Rationale for Evaluation and Treatment: Rehabilitation  ONSET DATE: years ago  SUBJECTIVE:                                                                                                                                                                                                          SUBJECTIVE STATEMENT: Patient reports that he has been having problems for years. However, it has been getting progressively worse. He had pain in his neck a long time ago which kept him from working out for 2-3 months. He has not done much strenuous exercise since he had a brian tumor removed on 02/13/21. His left shoulder bothers him more than the right shoulder as it will keep him up at night. He typically moves around a lot while he sleeps  as he can only sleep 2-3 hours at a time prior to be woken up due to his pain.  Hand dominance: Right  PERTINENT HISTORY:  Cancer, history of seizures, arthritis, and asthma  PAIN:  Are you having pain? Yes: NPRS scale: Current: 4-5/10 Best: 4-5/10 Worst: 8/10  Pain location: left side of neck and posterior shoulder Pain description: constant sharp  Aggravating factors: activity, turning his head, reaching Relieving factors: medication and laying down   PRECAUTIONS: None  RED FLAGS: None     WEIGHT BEARING RESTRICTIONS: No  FALLS:  Has patient fallen in last 6 months? No  LIVING ENVIRONMENT: Lives with: lives with their family Lives in: House/apartment  OCCUPATION: retired  PLOF: Independent  PATIENT GOALS: reduced pain, be able to do his yard work, and improved sleep  NEXT MD VISIT: July 2025  OBJECTIVE:  Note: Objective measures were completed at Evaluation unless otherwise noted.  PATIENT SURVEYS:  NDI:  NECK DISABILITY INDEX  Date: 12/19/23 Score  Pain intensity 2 = The pain is moderate at the moment  2. Personal care (washing, dressing, etc.) 1 =  I can look after myself normally but it causes extra pain  3. Lifting 1 =  I can lift heavy weights but it gives extra pain  4. Reading 3 = I can't read as much as I want because of moderate pain in my neck  5. Headaches 2 =  I have moderate headaches, which come infrequently  6. Concentration 1 =  I can concentrate fully when I want to with slight difficulty   7. Work 2 = I  can do most of my usual work, but no more  8. Driving 3 = I can't drive my car as long as I want because of moderate pain in my neck  9. Sleeping 2 = My sleep is mildly disturbed (1-2 hrs sleepless)  10. Recreation 2 = I am able to engage in most, but not all of my usual recreation activities because of   pain in my neck  Total 19/50   Minimum Detectable Change (90% confidence): 5 points or 10% points  COGNITION: Overall cognitive status: Within functional limits for tasks assessed  SENSATION: Patient reports intermittent tingling in his left armpit, but none currently  POSTURE: forward head  PALPATION: TTP: left upper trapezius, supraspinatus, infraspinatus, and scapular stabilizers   CERVICAL ROM:   Active ROM A/PROM (deg) eval  Flexion 42; pain began at 26 degrees  Extension 46; painful at end range   Right lateral flexion 18; familiar sharp pain  Left lateral flexion 32; painful   Right rotation 60; discomfort  Left rotation 63; discomfort    (Blank rows = not tested)  UPPER EXTREMITY ROM:  Active ROM Right eval Left eval  Shoulder flexion 163 129; pain began at 90 degrees   Shoulder extension    Shoulder abduction 90; discomfort 131; pain began at 90 degrees  Shoulder adduction    Shoulder extension    Shoulder internal rotation To T8; discomfort  To T11; discomfort  Shoulder external rotation To T1; familiar left sided pain To C7; familiar sharp pain   Elbow flexion    Elbow extension    Wrist flexion    Wrist extension    Wrist ulnar deviation    Wrist radial deviation    Wrist pronation    Wrist supination     (Blank rows = not tested)  UPPER EXTREMITY MMT:  MMT Right eval Left eval  Shoulder  flexion 4+/5; uncomfortable  4+/5; familiar pain  Shoulder extension    Shoulder abduction 5/5; uncomfortable 5/5; uncomfortable  Shoulder adduction    Shoulder extension    Shoulder internal rotation 4+/5; uncomfortable 5/5  Shoulder external rotation  4+/5; uncomfortable ;4/5; familiar shoulder pain  Middle trapezius    Lower trapezius    Elbow flexion 5/5 5/5  Elbow extension 5/5 4+/5  Wrist flexion    Wrist extension    Wrist ulnar deviation    Wrist radial deviation    Wrist pronation    Wrist supination    Grip strength     (Blank rows = not tested)  CERVICAL SPECIAL TESTS:  Spurling's test: Positive, Distraction test: Negative, and Sharp pursor's test: Negative  TREATMENT DATE:                                                                                                                                  PATIENT EDUCATION:  Education details: POC, prognosis, healing, objective findings, and goals for physical therapy Person educated: Patient Education method: Explanation Education comprehension: verbalized understanding  HOME EXERCISE PROGRAM:   ASSESSMENT:  CLINICAL IMPRESSION: Patient is a 48 y.o. male who was seen today for physical therapy evaluation and treatment for left sided cervical radiculopathy. He presented with moderate pain severity and irritability with cervical and left shoulder AROM and left upper extremity manual muscle testing reproducing his familiar symptoms. Recommend that he continue with skilled physical therapy to address his impairments to maximize his functional mobility.    OBJECTIVE IMPAIRMENTS: decreased activity tolerance, decreased mobility, decreased ROM, decreased strength, hypomobility, impaired tone, impaired UE functional use, and pain.   ACTIVITY LIMITATIONS: sleeping and reach over head  PARTICIPATION LIMITATIONS: cleaning, community activity, and yard work  PERSONAL FACTORS: Time since onset of injury/illness/exacerbation and 3+ comorbidities: Cancer, history of seizures, arthritis, and asthma are also affecting patient's functional outcome.   REHAB POTENTIAL: Good  CLINICAL DECISION MAKING: Evolving/moderate complexity  EVALUATION COMPLEXITY: Moderate   GOALS: Goals  reviewed with patient? Yes  LONG TERM GOALS: Target date: 01/16/24  Patient will be independent with his HEP.  Baseline:  Goal status: INITIAL  2.  Patient will be able to complete his daily activities without his familiar symptoms exceeding 5/10. Baseline:  Goal status: INITIAL  3.  Patient will improve his NDI score to 28% disability or less for improved perceived function with his day activities.  Baseline:  Goal status: INITIAL  4.  Patient will improve his right cervical side bending to at least 26 degrees for improved cervical mobility.  Baseline:  Goal status: INITIAL  5.  Patient will improve his right shoulder abduction to at least 120 degrees for improved function reaching overhead.  Baseline:  Goal status: INITIAL  PLAN:  PT FREQUENCY: 2-3x/week  PT DURATION: 4 weeks  PLANNED INTERVENTIONS: 97164- PT Re-evaluation, 97750- Physical Performance Testing, 97110-Therapeutic exercises, 97530- Therapeutic activity, [id number]- Neuromuscular re-education, 97535-  Self Care, 16109- Manual therapy, [id number]- Electrical stimulation (unattended), 97016- Vasopneumatic device, [id number]- Traction (mechanical), [id number] (1-2 muscles), 20561 (3+ muscles)- Dry Needling, Patient/Family education, Joint mobilization, Spinal mobilization, Cryotherapy, and Moist heat  PLAN FOR NEXT SESSION: manual therapy, chin tucks, cervical SNAG's, and modalities as needed   Lane Pinon, PT 12/19/2023, 4:55 PM

## 2023-12-21 ENCOUNTER — Ambulatory Visit

## 2023-12-21 DIAGNOSIS — M5412 Radiculopathy, cervical region: Secondary | ICD-10-CM | POA: Diagnosis not present

## 2023-12-21 NOTE — Therapy (Signed)
 OUTPATIENT PHYSICAL THERAPY CERVICAL TREATMENT   Patient Name: Edwin Moreno MRN: [id number] DOB:09-Oct-1975, 48 y.o., male Today's Date: 12/21/2023  END OF SESSION:  PT End of Session - 12/21/23 0803     Visit Number 2    Number of Visits 12    Date for PT Re-Evaluation 01/20/24    PT Start Time 0800    PT Stop Time 0839    PT Time Calculation (min) 39 min    Activity Tolerance Patient tolerated treatment well    Behavior During Therapy WFL for tasks assessed/performed           Past Medical History:  Diagnosis Date   Arthritis    Asthma    CHILDHOOD HX   GERD (gastroesophageal reflux disease)    Gout    H/O removal of cyst    hip as a child   Hyperlipidemia    Past Surgical History:  Procedure Laterality Date   APPLICATION OF CRANIAL NAVIGATION N/A 02/13/2021   Procedure: APPLICATION OF CRANIAL NAVIGATION;  Surgeon: Cannon Champion, MD;  Location: [facility] OR;  Service: Neurosurgery;  Laterality: N/A;  RM 19   CRANIOTOMY Left 02/13/2021   Procedure: Left Craniotomy for tumor resection with brainlab;  Surgeon: Cannon Champion, MD;  Location: [facility] OR;  Service: Neurosurgery;  Laterality: Left;   Patient Active Problem List   Diagnosis Date Noted   Focal seizures (HCC) 09/14/2021   Cancer of parietal lobe (HCC) 03/06/2021   Mass of parietal lobe 02/13/2021   Anaplastic oligodendroglioma, IDH mutant and 1p/19q-codeleted (HCC) 02/13/2021   Hypertriglyceridemia 09/18/2015   Gout 09/18/2015   Nodule, subcutaneous 05/02/2015   REFERRING PROVIDER: Janeth Medicus, MD   REFERRING DIAG: Radiculopathy, cervical region   THERAPY DIAG:  Radiculopathy, cervical region  Rationale for Evaluation and Treatment: Rehabilitation  ONSET DATE: years ago  SUBJECTIVE:                                                                                                                                                                                                          SUBJECTIVE STATEMENT: Patient reports that he is hurting a little this morning from sleeping last night.  Hand dominance: Right  PERTINENT HISTORY:  Cancer, history of seizures, arthritis, and asthma  PAIN:  Are you having pain? Yes: NPRS scale: Current: 5/10 Best: 4-5/10 Worst: 8/10  Pain location: left side of neck and posterior shoulder Pain description: constant sharp  Aggravating factors: activity, turning his head, reaching Relieving factors: medication and laying down   PRECAUTIONS: None  RED FLAGS: None     WEIGHT BEARING RESTRICTIONS: No  FALLS:  Has patient fallen in last 6 months? No  LIVING ENVIRONMENT: Lives with: lives with their family Lives in: House/apartment  OCCUPATION: retired  PLOF: Independent  PATIENT GOALS: reduced pain, be able to do his yard work, and improved sleep  NEXT MD VISIT: July 2025  OBJECTIVE:  Note: Objective measures were completed at Evaluation unless otherwise noted.  PATIENT SURVEYS:  NDI:  NECK DISABILITY INDEX  Date: 12/19/23 Score  Pain intensity 2 = The pain is moderate at the moment  2. Personal care (washing, dressing, etc.) 1 =  I can look after myself normally but it causes extra pain  3. Lifting 1 =  I can lift heavy weights but it gives extra pain  4. Reading 3 = I can't read as much as I want because of moderate pain in my neck  5. Headaches 2 =  I have moderate headaches, which come infrequently  6. Concentration 1 =  I can concentrate fully when I want to with slight difficulty   7. Work 2 = I can do most of my usual work, but no more  8. Driving 3 = I can't drive my car as long as I want because of moderate pain in my neck  9. Sleeping 2 = My sleep is mildly disturbed (1-2 hrs sleepless)  10. Recreation 2 = I am able to engage in most, but not all of my usual recreation activities because of   pain in my neck  Total 19/50   Minimum Detectable Change (90% confidence): 5 points or 10%  points  COGNITION: Overall cognitive status: Within functional limits for tasks assessed  SENSATION: Patient reports intermittent tingling in his left armpit, but none currently  POSTURE: forward head  PALPATION: TTP: left upper trapezius, supraspinatus, infraspinatus, and scapular stabilizers   CERVICAL ROM:   Active ROM A/PROM (deg) eval  Flexion 42; pain began at 26 degrees  Extension 46; painful at end range   Right lateral flexion 18; familiar sharp pain  Left lateral flexion 32; painful   Right rotation 60; discomfort  Left rotation 63; discomfort    (Blank rows = not tested)  UPPER EXTREMITY ROM:  Active ROM Right eval Left eval  Shoulder flexion 163 129; pain began at 90 degrees   Shoulder extension    Shoulder abduction 90; discomfort 131; pain began at 90 degrees  Shoulder adduction    Shoulder extension    Shoulder internal rotation To T8; discomfort  To T11; discomfort  Shoulder external rotation To T1; familiar left sided pain To C7; familiar sharp pain   Elbow flexion    Elbow extension    Wrist flexion    Wrist extension    Wrist ulnar deviation    Wrist radial deviation    Wrist pronation    Wrist supination     (Blank rows = not tested)  UPPER EXTREMITY MMT:  MMT Right eval Left eval  Shoulder flexion 4+/5; uncomfortable  4+/5; familiar pain  Shoulder extension    Shoulder abduction 5/5; uncomfortable 5/5; uncomfortable  Shoulder adduction    Shoulder extension    Shoulder internal rotation 4+/5; uncomfortable 5/5  Shoulder external rotation 4+/5; uncomfortable ;4/5; familiar shoulder pain  Middle trapezius    Lower trapezius    Elbow flexion 5/5 5/5  Elbow extension 5/5 4+/5  Wrist flexion    Wrist extension    Wrist ulnar deviation  Wrist radial deviation    Wrist pronation    Wrist supination    Grip strength     (Blank rows = not tested)  CERVICAL SPECIAL TESTS:  Spurling's test: Positive, Distraction test: Negative, and  Sharp pursor's test: Negative  TREATMENT DATE:                                                                                                                                                                  EXERCISE LOG  Exercise Repetitions and Resistance Comments  Resisted row Green t-band x 2 minutes    Resisted pull down Green t-band x 2 minutes   Shoulder ER  Green t-band x 15 reps each    Manual therapy See below   Self STM with tennis ball     B shoulder IR isometric  2 minutes w/ 5 second hold    Blank cell = exercise not performed today  Manual Therapy Soft Tissue Mobilization: left upper trapezius, supraspinatus, infraspinatus, and deltoid, for reduced pain and tone Joint Mobilizations: left AC joint, grade I-III   PATIENT EDUCATION:  Education details: HEP Person educated: Patient Education method: Explanation Education comprehension: verbalized understanding  HOME EXERCISE PROGRAM:   ASSESSMENT:  CLINICAL IMPRESSION: Patient was introduced to multiple new interventions for reduced pain. He experienced a mild increase in his familiar symptoms on the left shoulder when external rotation was attempted. However, manual therapy was able to moderately reduce his familiar symptoms with soft tissue mobilization to his left upper trapezius and infraspinatus being the most effective. He reported feeling a little better upon the conclusion of treatment. He continues to require skilled physical therapy to address his remaining impairments to return to his prior level of function.   OBJECTIVE IMPAIRMENTS: decreased activity tolerance, decreased mobility, decreased ROM, decreased strength, hypomobility, impaired tone, impaired UE functional use, and pain.   ACTIVITY LIMITATIONS: sleeping and reach over head  PARTICIPATION LIMITATIONS: cleaning, community activity, and yard work  PERSONAL FACTORS: Time since onset of injury/illness/exacerbation and 3+ comorbidities: Cancer,  history of seizures, arthritis, and asthma are also affecting patient's functional outcome.   REHAB POTENTIAL: Good  CLINICAL DECISION MAKING: Evolving/moderate complexity  EVALUATION COMPLEXITY: Moderate   GOALS: Goals reviewed with patient? Yes  LONG TERM GOALS: Target date: 01/16/24  Patient will be independent with his HEP.  Baseline:  Goal status: INITIAL  2.  Patient will be able to complete his daily activities without his familiar symptoms exceeding 5/10. Baseline:  Goal status: INITIAL  3.  Patient will improve his NDI score to 28% disability or less for improved perceived function with his day activities.  Baseline:  Goal status: INITIAL  4.  Patient will improve his right cervical side bending to at least 26 degrees for  improved cervical mobility.  Baseline:  Goal status: INITIAL  5.  Patient will improve his right shoulder abduction to at least 120 degrees for improved function reaching overhead.  Baseline:  Goal status: INITIAL  PLAN:  PT FREQUENCY: 2-3x/week  PT DURATION: 4 weeks  PLANNED INTERVENTIONS: 97164- PT Re-evaluation, 97750- Physical Performance Testing, 97110-Therapeutic exercises, 97530- Therapeutic activity, [id number]- Neuromuscular re-education, 97535- Self Care, 01093- Manual therapy, G0283- Electrical stimulation (unattended), 97016- Vasopneumatic device, [id number]- Traction (mechanical), 20560 (1-2 muscles), 20561 (3+ muscles)- Dry Needling, Patient/Family education, Joint mobilization, Spinal mobilization, Cryotherapy, and Moist heat  PLAN FOR NEXT SESSION: manual therapy, chin tucks, cervical SNAG's, and modalities as needed   Lane Pinon, PT 12/21/2023, 2:44 PM

## 2023-12-26 ENCOUNTER — Encounter: Payer: Self-pay | Admitting: *Deleted

## 2023-12-26 ENCOUNTER — Ambulatory Visit: Admitting: *Deleted

## 2023-12-26 DIAGNOSIS — M5412 Radiculopathy, cervical region: Secondary | ICD-10-CM | POA: Diagnosis not present

## 2023-12-26 NOTE — Therapy (Addendum)
 OUTPATIENT PHYSICAL THERAPY CERVICAL TREATMENT   Patient Name: RASHIDI LOH MRN: [id number] DOB:07-Oct-1975, 48 y.o., male Today's Date: 12/26/2023  END OF SESSION:  PT End of Session - 12/26/23 0805     Visit Number 3    Number of Visits 12    Date for PT Re-Evaluation 01/20/24    Authorization - Number of Visits 7    No MODALITIES   PT Start Time 0800    PT Stop Time 0850    PT Time Calculation (min) 50 min           Past Medical History:  Diagnosis Date   Arthritis    Asthma    CHILDHOOD HX   GERD (gastroesophageal reflux disease)    Gout    H/O removal of cyst    hip as a child   Hyperlipidemia    Past Surgical History:  Procedure Laterality Date   APPLICATION OF CRANIAL NAVIGATION N/A 02/13/2021   Procedure: APPLICATION OF CRANIAL NAVIGATION;  Surgeon: Cheryle Debby LABOR, MD;  Location: [facility] OR;  Service: Neurosurgery;  Laterality: N/A;  RM 19   CRANIOTOMY Left 02/13/2021   Procedure: Left Craniotomy for tumor resection with brainlab;  Surgeon: Cheryle Debby LABOR, MD;  Location: [facility] OR;  Service: Neurosurgery;  Laterality: Left;   Patient Active Problem List   Diagnosis Date Noted   Focal seizures (HCC) 09/14/2021   Cancer of parietal lobe (HCC) 03/06/2021   Mass of parietal lobe 02/13/2021   Anaplastic oligodendroglioma, IDH mutant and 1p/19q-codeleted (HCC) 02/13/2021   Hypertriglyceridemia 09/18/2015   Gout 09/18/2015   Nodule, subcutaneous 05/02/2015   REFERRING PROVIDER: Sharl Selinda Dover, MD   REFERRING DIAG: Radiculopathy, cervical region   THERAPY DIAG:  Radiculopathy, cervical region  Rationale for Evaluation and Treatment: Rehabilitation  ONSET DATE: years ago  SUBJECTIVE:                                                                                                                                                                                                         SUBJECTIVE STATEMENT: Patient reports that he is hurting a little  this morning from sleeping last night.4-5/10. MRI of shldr and neck showed bulging disc in the neck and partial tear LT shldr RTC. Cervical injection tomorrow   Hand dominance: Right  PERTINENT HISTORY:  Cancer, history of seizures, arthritis, and asthma  PAIN:  Are you having pain? Yes: NPRS scale: Current: 5/10 Best: 4-5/10 Worst: 8/10  Pain location: left side of neck and posterior shoulder Pain description: constant sharp  Aggravating factors: activity,  turning his head, reaching Relieving factors: medication and laying down   PRECAUTIONS: None  RED FLAGS: None     WEIGHT BEARING RESTRICTIONS: No  FALLS:  Has patient fallen in last 6 months? No  LIVING ENVIRONMENT: Lives with: lives with their family Lives in: House/apartment  OCCUPATION: retired  PLOF: Independent  PATIENT GOALS: reduced pain, be able to do his yard work, and improved sleep  NEXT MD VISIT: July 2025  OBJECTIVE:  Note: Objective measures were completed at Evaluation unless otherwise noted.  PATIENT SURVEYS:  NDI:  NECK DISABILITY INDEX  Date: 12/19/23 Score  Pain intensity 2 = The pain is moderate at the moment  2. Personal care (washing, dressing, etc.) 1 =  I can look after myself normally but it causes extra pain  3. Lifting 1 =  I can lift heavy weights but it gives extra pain  4. Reading 3 = I can't read as much as I want because of moderate pain in my neck  5. Headaches 2 =  I have moderate headaches, which come infrequently  6. Concentration 1 =  I can concentrate fully when I want to with slight difficulty   7. Work 2 = I can do most of my usual work, but no more  8. Driving 3 = I can't drive my car as long as I want because of moderate pain in my neck  9. Sleeping 2 = My sleep is mildly disturbed (1-2 hrs sleepless)  10. Recreation 2 = I am able to engage in most, but not all of my usual recreation activities because of   pain in my neck  Total 19/50   Minimum Detectable Change (90%  confidence): 5 points or 10% points  COGNITION: Overall cognitive status: Within functional limits for tasks assessed  SENSATION: Patient reports intermittent tingling in his left armpit, but none currently  POSTURE: forward head  PALPATION: TTP: left upper trapezius, supraspinatus, infraspinatus, and scapular stabilizers   CERVICAL ROM:   Active ROM A/PROM (deg) eval  Flexion 42; pain began at 26 degrees  Extension 46; painful at end range   Right lateral flexion 18; familiar sharp pain  Left lateral flexion 32; painful   Right rotation 60; discomfort  Left rotation 63; discomfort    (Blank rows = not tested)  UPPER EXTREMITY ROM:  Active ROM Right eval Left eval  Shoulder flexion 163 129; pain began at 90 degrees   Shoulder extension    Shoulder abduction 90; discomfort 131; pain began at 90 degrees  Shoulder adduction    Shoulder extension    Shoulder internal rotation To T8; discomfort  To T11; discomfort  Shoulder external rotation To T1; familiar left sided pain To C7; familiar sharp pain   Elbow flexion    Elbow extension    Wrist flexion    Wrist extension    Wrist ulnar deviation    Wrist radial deviation    Wrist pronation    Wrist supination     (Blank rows = not tested)  UPPER EXTREMITY MMT:  MMT Right eval Left eval  Shoulder flexion 4+/5; uncomfortable  4+/5; familiar pain  Shoulder extension    Shoulder abduction 5/5; uncomfortable 5/5; uncomfortable  Shoulder adduction    Shoulder extension    Shoulder internal rotation 4+/5; uncomfortable 5/5  Shoulder external rotation 4+/5; uncomfortable ;4/5; familiar shoulder pain  Middle trapezius    Lower trapezius    Elbow flexion 5/5 5/5  Elbow extension 5/5 4+/5  Wrist  flexion    Wrist extension    Wrist ulnar deviation    Wrist radial deviation    Wrist pronation    Wrist supination    Grip strength     (Blank rows = not tested)  CERVICAL SPECIAL TESTS:  Spurling's test: Positive,  Distraction test: Negative, and Sharp pursor's test: Negative  TREATMENT DATE:    12-26-23                                                                                                                                                               EXERCISE LOG     LT  shldr/ neck  Exercise Repetitions and Resistance Comments  UBE  X 6 mins 120 RPMs   Resisted row  XTS blue x 10 hold 5 secs    Resisted pull down  XTS blue x 10 hold 5 secs   Shoulder ER /IR Green t-band 2x10 reps each way   Manual therapy See below   Self STM with tennis ball     B shoulder IR isometric      Blank cell = exercise not performed today  Manual Therapy Soft Tissue Mobilization: left upper trapezius, supraspinatus, infraspinatus, and deltoid, for reduced pain and tone Joint Mobilizations:  ,     PATIENT EDUCATION:  Education details: HEP Person educated: Patient Education method: Explanation Education comprehension: verbalized understanding  HOME EXERCISE PROGRAM:   ASSESSMENT:  CLINICAL IMPRESSION: Patient arrived today doing fair with cervicaland LT shldr pain as well as referred pain medial border of LT shldr blade. Rx focused on AAROM as well as light shldr/ scapular strengthening with XTS. Manual STW/TPR periscapular with many notable Tps  and and good releases. Pt has injection tomorrow.  OBJECTIVE IMPAIRMENTS: decreased activity tolerance, decreased mobility, decreased ROM, decreased strength, hypomobility, impaired tone, impaired UE functional use, and pain.   ACTIVITY LIMITATIONS: sleeping and reach over head  PARTICIPATION LIMITATIONS: cleaning, community activity, and yard work  PERSONAL FACTORS: Time since onset of injury/illness/exacerbation and 3+ comorbidities: Cancer, history of seizures, arthritis, and asthma are also affecting patient's functional outcome.   REHAB POTENTIAL: Good  CLINICAL DECISION MAKING: Evolving/moderate complexity  EVALUATION COMPLEXITY:  Moderate   GOALS: Goals reviewed with patient? Yes  LONG TERM GOALS: Target date: 01/16/24  Patient will be independent with his HEP.  Baseline:  Goal status: INITIAL  2.  Patient will be able to complete his daily activities without his familiar symptoms exceeding 5/10. Baseline:  Goal status: INITIAL  3.  Patient will improve his NDI score to 28% disability or less for improved perceived function with his day activities.  Baseline:  Goal status: INITIAL  4.  Patient will improve his right cervical side bending to at least 26 degrees for improved cervical mobility.  Baseline:  Goal status: INITIAL  5.  Patient will improve his right shoulder abduction to at least 120 degrees for improved function reaching overhead.  Baseline:  Goal status: INITIAL  PLAN:  PT FREQUENCY: 2-3x/week  PT DURATION: 4 weeks  PLANNED INTERVENTIONS: 97164- PT Re-evaluation, 97750- Physical Performance Testing, 97110-Therapeutic exercises, 97530- Therapeutic activity, [id number]- Neuromuscular re-education, 97535- Self Care, 02859- Manual therapy, [id number]- Electrical stimulation (unattended), 97016- Vasopneumatic device, [id number]- Traction (mechanical), 20560 (1-2 muscles), 20561 (3+ muscles)- Dry Needling, Patient/Family education, Joint mobilization, Spinal mobilization, Cryotherapy, and Moist heat  PLAN FOR NEXT SESSION: manual therapy, chin tucks, cervical SNAG's,  NO MODALITIES   Gerard Bonus,CHRIS, PTA 12/26/2023, 10:23 AM

## 2024-01-03 ENCOUNTER — Ambulatory Visit: Attending: Orthopedic Surgery | Admitting: *Deleted

## 2024-01-03 ENCOUNTER — Encounter: Payer: Self-pay | Admitting: *Deleted

## 2024-01-03 DIAGNOSIS — M5412 Radiculopathy, cervical region: Secondary | ICD-10-CM | POA: Insufficient documentation

## 2024-01-03 NOTE — Therapy (Signed)
 OUTPATIENT PHYSICAL THERAPY CERVICAL TREATMENT   Patient Name: Edwin Moreno MRN: [id number] DOB:Nov 04, 1975, 48 y.o., male Today's Date: 01/03/2024  END OF SESSION:  PT End of Session - 01/03/24 0806     Visit Number 4    Number of Visits 12    Date for PT Re-Evaluation 01/20/24    Authorization - Number of Visits 7     PT Start Time 0800    PT Stop Time 0850    PT Time Calculation (min) 50 min           Past Medical History:  Diagnosis Date   Arthritis    Asthma    CHILDHOOD HX   GERD (gastroesophageal reflux disease)    Gout    H/O removal of cyst    hip as a child   Hyperlipidemia    Past Surgical History:  Procedure Laterality Date   APPLICATION OF CRANIAL NAVIGATION N/A 02/13/2021   Procedure: APPLICATION OF CRANIAL NAVIGATION;  Surgeon: Cheryle Debby LABOR, MD;  Location: [facility] OR;  Service: Neurosurgery;  Laterality: N/A;  RM 19   CRANIOTOMY Left 02/13/2021   Procedure: Left Craniotomy for tumor resection with brainlab;  Surgeon: Cheryle Debby LABOR, MD;  Location: [facility] OR;  Service: Neurosurgery;  Laterality: Left;   Patient Active Problem List   Diagnosis Date Noted   Focal seizures (HCC) 09/14/2021   Cancer of parietal lobe (HCC) 03/06/2021   Mass of parietal lobe 02/13/2021   Anaplastic oligodendroglioma, IDH mutant and 1p/19q-codeleted (HCC) 02/13/2021   Hypertriglyceridemia 09/18/2015   Gout 09/18/2015   Nodule, subcutaneous 05/02/2015   REFERRING PROVIDER: Sharl Selinda Dover, MD   REFERRING DIAG: Radiculopathy, cervical region   THERAPY DIAG:  Radiculopathy, cervical region  Rationale for Evaluation and Treatment: Rehabilitation  ONSET DATE: years ago  SUBJECTIVE:                                                                                                                                                                                                         SUBJECTIVE STATEMENT: Patient reports some relief from injection. MRI of shldr and  neck showed bulging disc in the neck and partial tear LT shldr RTC .Also ACJ arthritis  Slept better last night Hand dominance: Right  PERTINENT HISTORY:  Cancer, history of seizures, arthritis, and asthma  PAIN:  Are you having pain? Yes: NPRS scale: Current: 3/10 Best: 4-5/10 Worst: 8/10  Pain location: left side of neck and posterior shoulder Pain description: constant sharp  Aggravating factors: activity, turning his head, reaching Relieving factors: medication and  laying down   PRECAUTIONS: None  RED FLAGS: None     WEIGHT BEARING RESTRICTIONS: No  FALLS:  Has patient fallen in last 6 months? No  LIVING ENVIRONMENT: Lives with: lives with their family Lives in: House/apartment  OCCUPATION: retired  PLOF: Independent  PATIENT GOALS: reduced pain, be able to do his yard work, and improved sleep  NEXT MD VISIT: July 2025  OBJECTIVE:  Note: Objective measures were completed at Evaluation unless otherwise noted.  PATIENT SURVEYS:  NDI:  NECK DISABILITY INDEX  Date: 12/19/23 Score  Pain intensity 2 = The pain is moderate at the moment  2. Personal care (washing, dressing, etc.) 1 =  I can look after myself normally but it causes extra pain  3. Lifting 1 =  I can lift heavy weights but it gives extra pain  4. Reading 3 = I can't read as much as I want because of moderate pain in my neck  5. Headaches 2 =  I have moderate headaches, which come infrequently  6. Concentration 1 =  I can concentrate fully when I want to with slight difficulty   7. Work 2 = I can do most of my usual work, but no more  8. Driving 3 = I can't drive my car as long as I want because of moderate pain in my neck  9. Sleeping 2 = My sleep is mildly disturbed (1-2 hrs sleepless)  10. Recreation 2 = I am able to engage in most, but not all of my usual recreation activities because of   pain in my neck  Total 19/50   Minimum Detectable Change (90% confidence): 5 points or 10%  points  COGNITION: Overall cognitive status: Within functional limits for tasks assessed  SENSATION: Patient reports intermittent tingling in his left armpit, but none currently  POSTURE: forward head  PALPATION: TTP: left upper trapezius, supraspinatus, infraspinatus, and scapular stabilizers   CERVICAL ROM:   Active ROM A/PROM (deg) eval  Flexion 42; pain began at 26 degrees  Extension 46; painful at end range   Right lateral flexion 18; familiar sharp pain  Left lateral flexion 32; painful   Right rotation 60; discomfort  Left rotation 63; discomfort    (Blank rows = not tested)  UPPER EXTREMITY ROM:  Active ROM Right eval Left eval  Shoulder flexion 163 129; pain began at 90 degrees   Shoulder extension    Shoulder abduction 90; discomfort 131; pain began at 90 degrees  Shoulder adduction    Shoulder extension    Shoulder internal rotation To T8; discomfort  To T11; discomfort  Shoulder external rotation To T1; familiar left sided pain To C7; familiar sharp pain   Elbow flexion    Elbow extension    Wrist flexion    Wrist extension    Wrist ulnar deviation    Wrist radial deviation    Wrist pronation    Wrist supination     (Blank rows = not tested)  UPPER EXTREMITY MMT:  MMT Right eval Left eval  Shoulder flexion 4+/5; uncomfortable  4+/5; familiar pain  Shoulder extension    Shoulder abduction 5/5; uncomfortable 5/5; uncomfortable  Shoulder adduction    Shoulder extension    Shoulder internal rotation 4+/5; uncomfortable 5/5  Shoulder external rotation 4+/5; uncomfortable ;4/5; familiar shoulder pain  Middle trapezius    Lower trapezius    Elbow flexion 5/5 5/5  Elbow extension 5/5 4+/5  Wrist flexion    Wrist extension  Wrist ulnar deviation    Wrist radial deviation    Wrist pronation    Wrist supination    Grip strength     (Blank rows = not tested)  CERVICAL SPECIAL TESTS:  Spurling's test: Positive, Distraction test: Negative, and  Sharp pursor's test: Negative  TREATMENT DATE:    01-03-24                                                                                                                                                               EXERCISE LOG     LT  shldr/ neck  Exercise Repetitions and Resistance Comments  UBE  X 6 mins 120 RPMs   Resisted row   blue 2x 10 hold 5 secs    Resisted pull down     Shoulder ER /IR Red t-band 2x10 reps each way   Manual therapy See below   Pulleys X 5 mins   Self STM with tennis ball     B shoulder IR isometric      Blank cell = exercise not performed today  Reviewed HEP Manual Therapy Soft Tissue Mobilization: left upper trapezius, Levator scap,supraspinatus, infraspinatus, and deltoid, for reduced pain and tone Joint Mobilizations:  ,     PATIENT EDUCATION:  Education details: HEP Person educated: Patient Education method: Explanation Education comprehension: verbalized understanding  HOME EXERCISE PROGRAM:   ASSESSMENT:  CLINICAL IMPRESSION: Patient arrived today doing fair with cervical and LT shldr pain and reports injection helped.  Rx focused  again on AAROM as well as light shldr/ scapular strengthening. Manual STW/TPR periscapular with many notable Tps  and and good releases.  OBJECTIVE IMPAIRMENTS: decreased activity tolerance, decreased mobility, decreased ROM, decreased strength, hypomobility, impaired tone, impaired UE functional use, and pain.   ACTIVITY LIMITATIONS: sleeping and reach over head  PARTICIPATION LIMITATIONS: cleaning, community activity, and yard work  PERSONAL FACTORS: Time since onset of injury/illness/exacerbation and 3+ comorbidities: Cancer, history of seizures, arthritis, and asthma are also affecting patient's functional outcome.   REHAB POTENTIAL: Good  CLINICAL DECISION MAKING: Evolving/moderate complexity  EVALUATION COMPLEXITY: Moderate   GOALS: Goals reviewed with patient? Yes  LONG TERM GOALS: Target date:  01/16/24  Patient will be independent with his HEP.  Baseline:  Goal status: INITIAL  2.  Patient will be able to complete his daily activities without his familiar symptoms exceeding 5/10. Baseline:  Goal status: INITIAL  3.  Patient will improve his NDI score to 28% disability or less for improved perceived function with his day activities.  Baseline:  Goal status: INITIAL  4.  Patient will improve his right cervical side bending to at least 26 degrees for improved cervical mobility.  Baseline:  Goal status: INITIAL  5.  Patient will improve his right shoulder abduction  to at least 120 degrees for improved function reaching overhead.  Baseline:  Goal status: INITIAL  PLAN:  PT FREQUENCY: 2-3x/week  PT DURATION: 4 weeks  PLANNED INTERVENTIONS: 97164- PT Re-evaluation, 97750- Physical Performance Testing, 97110-Therapeutic exercises, 97530- Therapeutic activity, [id number]- Neuromuscular re-education, 97535- Self Care, 02859- Manual therapy, G0283- Electrical stimulation (unattended), 97016- Vasopneumatic device, [id number]- Traction (mechanical), 20560 (1-2 muscles), 20561 (3+ muscles)- Dry Needling, Patient/Family education, Joint mobilization, Spinal mobilization, Cryotherapy, and Moist heat  PLAN FOR NEXT SESSION: manual therapy, chin tucks, cervical SNAG's,  NO MODALITIES   Chantry Headen,CHRIS, PTA 01/03/2024, 9:18 AM

## 2024-01-09 ENCOUNTER — Encounter: Admitting: *Deleted

## 2024-01-11 ENCOUNTER — Other Ambulatory Visit: Payer: Self-pay | Admitting: Internal Medicine

## 2024-01-11 DIAGNOSIS — R569 Unspecified convulsions: Secondary | ICD-10-CM

## 2024-01-11 DIAGNOSIS — C719 Malignant neoplasm of brain, unspecified: Secondary | ICD-10-CM

## 2024-01-26 ENCOUNTER — Ambulatory Visit (HOSPITAL_COMMUNITY)
Admission: RE | Admit: 2024-01-26 | Discharge: 2024-01-26 | Disposition: A | Discharge status: Home / Self Care | Source: Ambulatory Visit | Attending: Internal Medicine | Admitting: Internal Medicine

## 2024-01-26 DIAGNOSIS — C719 Malignant neoplasm of brain, unspecified: Secondary | ICD-10-CM | POA: Insufficient documentation

## 2024-01-26 MED ORDER — GADOBUTROL 1 MMOL/ML IV SOLN
10.0000 mL | Freq: Once | INTRAVENOUS | Status: AC | PRN
  Administered 2024-01-26: 10 mL via INTRAVENOUS

## 2024-01-30 ENCOUNTER — Inpatient Hospital Stay: Attending: Internal Medicine | Admitting: Internal Medicine

## 2024-01-30 DIAGNOSIS — R569 Unspecified convulsions: Secondary | ICD-10-CM

## 2024-01-30 DIAGNOSIS — C719 Malignant neoplasm of brain, unspecified: Secondary | ICD-10-CM | POA: Diagnosis not present

## 2024-01-30 NOTE — Progress Notes (Signed)
 I connected with Edwin Moreno on 01/30/24 at  9:00 AM EDT by telephone visit and verified that I am speaking with the correct person using two identifiers.  I discussed the limitations, risks, security and privacy concerns of performing an evaluation and management service by telemedicine and the availability of in-person appointments. I also discussed with the patient that there may be a patient responsible charge related to this service. The patient expressed understanding and agreed to proceed.   Other persons participating in the visit and their role in the encounter:  n/a  Patient's location:  Home Provider's location:  Office  Chief Complaint:  Anaplastic oligodendroglioma, IDH mutant and 1p/19q-codeleted (HCC)  Focal seizures (HCC)  History of Present Ilness: Edwin Moreno reports no clinical changes today.  Only sporadic small seizures.   Observations: Language and cognition at baseline  Imaging:  [facility] Clinician Interpretation: I have personally reviewed the CNS images as listed.  My interpretation, in the context of the patient's clinical presentation, is stable disease  MR BRAIN W WO CONTRAST Result Date: 01/26/2024 CLINICAL DATA:  Brain/CNS neoplasm, assess treatment response. Anaplastic oligodendroglioma, IDH mutant, 1p/19q codeleted. EXAM: MRI HEAD WITHOUT AND WITH CONTRAST TECHNIQUE: Multiplanar, multiecho pulse sequences of the brain and surrounding structures were obtained without and with intravenous contrast. CONTRAST:  10mL GADAVIST  GADOBUTROL  1 MMOL/ML IV SOLN COMPARISON:  MRI head 09/22/2023 and earlier. FINDINGS: Brain: Left parietal craniotomy with similar appearance of underlying resection cavity within the left parietal lobe near the vertex. Surrounding T2/FLAIR hyperintensity within the left parietal lobe extending into the periventricular Bastedo matter and in the posterior aspect of the cingulate gyrus, similar to 09/22/2023. Similar appearance of gyral thickening within  the posterior aspect of the cingulate gyrus. Postoperative susceptibility along the resection cavity is similar to prior. There is no nodular or masslike enhancement. No new intracranial lesions. No acute infarct. No new areas of intracranial blood products. No significant mass effect or midline shift. The basilar cisterns are patent. Posterior fossa is unremarkable. No extra-axial fluid collections. Ventricles: Normal size and configuration of the ventricles. Vascular: Skull base flow voids are visualized. Skull and upper cervical spine: Postsurgical changes of the calvarium without acute or aggressive finding. Sinuses/Orbits: Orbits are symmetric. Paranasal sinuses are clear. Other: Mastoid air cells are clear. IMPRESSION: Similar appearance of resection cavity in the left parietal lobe. Surrounding T2/FLAIR hyperintensity with cortical thickening in the posterior left cingulate gyrus is unchanged since 09/22/2023. No new intracranial lesion. Electronically Signed   By: Donnice Mania M.D.   On: 01/26/2024 11:26   Assessment and Plan: Anaplastic oligodendroglioma, IDH mutant and 1p/19q-codeleted (HCC)  Focal seizures (HCC)  Edwin Moreno is clinically stable today, no new or progressive changes.     MRI demonstrates stable findings, with unchanged T2/FLAIR signal over the past year.   Will con't imaging surveillance off systemic therapy.   Will con't Lamictal  200mg  BID as prior.   Ativan  2mg  PR q6 PRN will also continue prolonged or clustered breakthrough seizures.    We ask that Edwin Moreno return to clinic in 6 months following next brain MRI, or sooner as needed.  Follow Up Instructions:  I discussed the assessment and treatment plan with the patient.  The patient was provided an opportunity to ask questions and all were answered.  The patient agreed with the plan and demonstrated understanding of the instructions.    The patient was advised to call back or seek an in-person evaluation if  the  symptoms worsen or if the condition fails to improve as anticipated.    Raeghan Demeter K Nikolis Berent, MD   I provided 20 minutes of non face-to-face telephone visit time during this encounter, and > 50% was spent counseling as documented under my assessment & plan.

## 2024-05-16 ENCOUNTER — Telehealth: Payer: Self-pay | Admitting: *Deleted

## 2024-05-16 NOTE — Telephone Encounter (Signed)
 Breslin Hemann [place], [phone] (home) calling to check status of disability form turned in several weeks ago.  Emailed to [email or web address].   Advised form was emailed to [email or web address] on 05/08/2024.  If you have the [employer] fax number will be glad to fax to them.  Unable to locate in notes and correspondence.  Will return call. .   Located form that was too light to be scanned,  Unable to read anything on form beyond what was written.  Darker copy made. Resent to patient email/  Confirmed receipt of email to [email or web address]  Copy made to be scanned to EMR.  Advised he sign the bottom of the [employer] authorization.at the bottom of page 4 of the email that reads 06 in the bottom right corner.  No further needs or questions at this time.

## 2024-07-25 ENCOUNTER — Other Ambulatory Visit: Payer: Self-pay | Admitting: *Deleted

## 2024-07-25 DIAGNOSIS — Q9359 Other deletions of part of a chromosome: Secondary | ICD-10-CM

## 2024-07-25 DIAGNOSIS — R569 Unspecified convulsions: Secondary | ICD-10-CM

## 2024-07-26 ENCOUNTER — Ambulatory Visit (HOSPITAL_COMMUNITY)
Admission: RE | Admit: 2024-07-26 | Discharge: 2024-07-26 | Disposition: A | Discharge status: Home / Self Care | Source: Ambulatory Visit | Attending: Internal Medicine | Admitting: Internal Medicine

## 2024-07-26 ENCOUNTER — Telehealth: Payer: Self-pay | Admitting: *Deleted

## 2024-07-26 DIAGNOSIS — C719 Malignant neoplasm of brain, unspecified: Secondary | ICD-10-CM | POA: Diagnosis present

## 2024-07-26 DIAGNOSIS — Q9359 Other deletions of part of a chromosome: Secondary | ICD-10-CM | POA: Insufficient documentation

## 2024-07-26 DIAGNOSIS — Z1509 Genetic susceptibility to other malignant neoplasm: Secondary | ICD-10-CM | POA: Insufficient documentation

## 2024-07-26 MED ORDER — GADOBUTROL 1 MMOL/ML IV SOLN
10.0000 mL | Freq: Once | INTRAVENOUS | Status: AC | PRN
  Administered 2024-07-26: 10 mL via INTRAVENOUS

## 2024-07-26 MED ORDER — LAMOTRIGINE 200 MG PO TABS: 200.0000 mg | ORAL_TABLET | Freq: Two times a day (BID) | ORAL | 1 refills | Status: AC

## 2024-07-26 NOTE — Telephone Encounter (Signed)
 PC to patient, appointment on 07/30/24 needs to be changed due to inclement weather.  Patient requested phone visit, scheduled on 08/10/24 at 9:00.  He verbalizes understanding.

## 2024-07-30 ENCOUNTER — Ambulatory Visit: Admitting: Internal Medicine

## 2024-08-13 ENCOUNTER — Inpatient Hospital Stay: Admitting: Internal Medicine
